# Patient Record
Sex: Female | Born: 2000 | Race: Black or African American | Hispanic: No | Marital: Single | Smoking: Former smoker
Health system: Southern US, Community
[De-identification: ages and names within clinical notes are randomized; demographics above are authoritative.]

## PROBLEM LIST (undated history)

## (undated) DIAGNOSIS — N390 Urinary tract infection, site not specified: Secondary | ICD-10-CM

## (undated) DIAGNOSIS — D649 Anemia, unspecified: Secondary | ICD-10-CM

## (undated) DIAGNOSIS — J45909 Unspecified asthma, uncomplicated: Secondary | ICD-10-CM

## (undated) DIAGNOSIS — R519 Headache, unspecified: Secondary | ICD-10-CM

## (undated) HISTORY — PX: THERAPEUTIC ABORTION: SHX798

---
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
## 2019-03-12 ENCOUNTER — Other Ambulatory Visit: Payer: Self-pay

## 2019-03-12 ENCOUNTER — Encounter (HOSPITAL_COMMUNITY): Payer: Self-pay | Admitting: Obstetrics and Gynecology

## 2019-03-12 ENCOUNTER — Emergency Department (HOSPITAL_COMMUNITY)
Admission: EM | Admit: 2019-03-12 | Discharge: 2019-03-12 | Discharge status: Against Medical Advice | Payer: Self-pay | Attending: Emergency Medicine | Admitting: Emergency Medicine

## 2019-03-12 DIAGNOSIS — Z5321 Procedure and treatment not carried out due to patient leaving prior to being seen by health care provider: Secondary | ICD-10-CM | POA: Insufficient documentation

## 2019-03-12 LAB — URINALYSIS, ROUTINE W REFLEX MICROSCOPIC
Bilirubin Urine: NEGATIVE
Glucose, UA: NEGATIVE mg/dL
Hgb urine dipstick: NEGATIVE
Ketones, ur: NEGATIVE mg/dL
Nitrite: NEGATIVE
Protein, ur: NEGATIVE mg/dL
Specific Gravity, Urine: 1.016 (ref 1.005–1.030)
pH: 8 (ref 5.0–8.0)

## 2019-03-12 LAB — COMPREHENSIVE METABOLIC PANEL
ALT: 17 U/L (ref 0–44)
AST: 17 U/L (ref 15–41)
Albumin: 4.2 g/dL (ref 3.5–5.0)
Alkaline Phosphatase: 66 U/L (ref 38–126)
Anion gap: 8 (ref 5–15)
BUN: 11 mg/dL (ref 6–20)
CO2: 27 mmol/L (ref 22–32)
Calcium: 9.5 mg/dL (ref 8.9–10.3)
Chloride: 104 mmol/L (ref 98–111)
Creatinine, Ser: 0.84 mg/dL (ref 0.44–1.00)
GFR calc Af Amer: >60 mL/min (ref >60)
GFR calc non Af Amer: >60 mL/min (ref >60)
Glucose, Bld: 119 mg/dL — ABNORMAL HIGH (ref 70–99)
Potassium: 4.1 mmol/L (ref 3.5–5.1)
Sodium: 139 mmol/L (ref 135–145)
Total Bilirubin: 0.4 mg/dL (ref 0.3–1.2)
Total Protein: 8 g/dL (ref 6.5–8.1)

## 2019-03-12 LAB — CBC
HCT: 38.2 % (ref 36.0–46.0)
Hemoglobin: 11.5 g/dL — ABNORMAL LOW (ref 12.0–15.0)
MCH: 25.6 pg — ABNORMAL LOW (ref 26.0–34.0)
MCHC: 30.1 g/dL (ref 30.0–36.0)
MCV: 84.9 fL (ref 80.0–100.0)
Platelets: 366 10*3/uL (ref 150–400)
RBC: 4.5 MIL/uL (ref 3.87–5.11)
RDW: 14.3 % (ref 11.5–15.5)
WBC: 10 10*3/uL (ref 4.0–10.5)
nRBC: 0 % (ref 0.0–0.2)

## 2019-03-12 LAB — LIPASE, BLOOD: Lipase: 29 U/L (ref 11–51)

## 2019-03-12 LAB — I-STAT BETA HCG BLOOD, ED (MC, WL, AP ONLY): I-stat hCG, quantitative: <5 m[IU]/mL (ref <5)

## 2019-03-12 MED ORDER — SODIUM CHLORIDE 0.9% FLUSH: 3.0000 mL | Freq: Once | INTRAVENOUS | Status: DC

## 2019-03-12 NOTE — ED Notes (Addendum)
Pt stated that she just wanted to know if she was pregnant. Pt informed that we can not give test results Pt LWBS after triage

## 2019-03-12 NOTE — ED Notes (Signed)
Patient informed registration that she was leaving.  

## 2019-03-12 NOTE — ED Triage Notes (Signed)
Patient reports she woke up this morning and had 10 bouts of emesis. Patient reports she is late for her period and concerned about pregnancy.  Patient reports belly pain but denies diarrhea, chest pain, SOB.

## 2019-05-22 ENCOUNTER — Encounter (HOSPITAL_COMMUNITY): Payer: Self-pay | Admitting: Emergency Medicine

## 2019-05-22 ENCOUNTER — Other Ambulatory Visit: Payer: Self-pay

## 2019-05-22 ENCOUNTER — Emergency Department (HOSPITAL_COMMUNITY)
Admission: EM | Admit: 2019-05-22 | Discharge: 2019-05-22 | Disposition: A | Discharge status: Home / Self Care | Payer: Medicaid - Out of State | Attending: Emergency Medicine | Admitting: Emergency Medicine

## 2019-05-22 DIAGNOSIS — R10813 Right lower quadrant abdominal tenderness: Secondary | ICD-10-CM | POA: Insufficient documentation

## 2019-05-22 DIAGNOSIS — R10814 Left lower quadrant abdominal tenderness: Secondary | ICD-10-CM | POA: Diagnosis not present

## 2019-05-22 DIAGNOSIS — R112 Nausea with vomiting, unspecified: Secondary | ICD-10-CM

## 2019-05-22 LAB — URINALYSIS, ROUTINE W REFLEX MICROSCOPIC
Bilirubin Urine: NEGATIVE
Glucose, UA: NEGATIVE mg/dL
Hgb urine dipstick: NEGATIVE
Ketones, ur: NEGATIVE mg/dL
Nitrite: NEGATIVE
Protein, ur: NEGATIVE mg/dL
Specific Gravity, Urine: 1.018 (ref 1.005–1.030)
pH: 7 (ref 5.0–8.0)

## 2019-05-22 LAB — COMPREHENSIVE METABOLIC PANEL
ALT: 19 U/L (ref 0–44)
AST: 16 U/L (ref 15–41)
Albumin: 4 g/dL (ref 3.5–5.0)
Alkaline Phosphatase: 56 U/L (ref 38–126)
Anion gap: 9 (ref 5–15)
BUN: 13 mg/dL (ref 6–20)
CO2: 22 mmol/L (ref 22–32)
Calcium: 9.5 mg/dL (ref 8.9–10.3)
Chloride: 105 mmol/L (ref 98–111)
Creatinine, Ser: 0.87 mg/dL (ref 0.44–1.00)
GFR calc Af Amer: >60 mL/min (ref >60)
GFR calc non Af Amer: >60 mL/min (ref >60)
Glucose, Bld: 97 mg/dL (ref 70–99)
Potassium: 4.1 mmol/L (ref 3.5–5.1)
Sodium: 136 mmol/L (ref 135–145)
Total Bilirubin: 0.4 mg/dL (ref 0.3–1.2)
Total Protein: 7.6 g/dL (ref 6.5–8.1)

## 2019-05-22 LAB — CBC
HCT: 35.5 % — ABNORMAL LOW (ref 36.0–46.0)
Hemoglobin: 11.3 g/dL — ABNORMAL LOW (ref 12.0–15.0)
MCH: 26.5 pg (ref 26.0–34.0)
MCHC: 31.8 g/dL (ref 30.0–36.0)
MCV: 83.1 fL (ref 80.0–100.0)
Platelets: 310 10*3/uL (ref 150–400)
RBC: 4.27 MIL/uL (ref 3.87–5.11)
RDW: 13.8 % (ref 11.5–15.5)
WBC: 9.3 10*3/uL (ref 4.0–10.5)
nRBC: 0 % (ref 0.0–0.2)

## 2019-05-22 LAB — I-STAT BETA HCG BLOOD, ED (MC, WL, AP ONLY): I-stat hCG, quantitative: <5 m[IU]/mL (ref <5)

## 2019-05-22 LAB — LIPASE, BLOOD: Lipase: 29 U/L (ref 11–51)

## 2019-05-22 MED ORDER — SODIUM CHLORIDE 0.9 % IV BOLUS: 1000.0000 mL | Freq: Once | INTRAVENOUS | Status: DC

## 2019-05-22 MED ORDER — ONDANSETRON 4 MG PO TBDP: 4.0000 mg | ORAL_TABLET | Freq: Three times a day (TID) | ORAL | 0 refills | Status: DC | PRN

## 2019-05-22 MED ORDER — ONDANSETRON 4 MG PO TBDP
4.0000 mg | ORAL_TABLET | Freq: Once | ORAL | Status: AC
  Administered 2019-05-22: 4 mg via ORAL
  Filled 2019-05-22: qty 1

## 2019-05-22 MED ORDER — SODIUM CHLORIDE 0.9% FLUSH
3.0000 mL | Freq: Once | INTRAVENOUS | Status: AC
  Administered 2019-05-22: 22:00:00 3 mL via INTRAVENOUS

## 2019-05-22 NOTE — ED Triage Notes (Signed)
Pt reports vomiting and diarrhea since Sunday. Unable to keep any food or fluids down. Denies any sick contacts Endorses sharp lower abd pain.

## 2019-05-22 NOTE — ED Notes (Signed)
Patient Alert and oriented to baseline. Stable and ambulatory to baseline. Patient verbalized understanding of the discharge instructions.  Patient belongings were taken by the patient.   

## 2019-05-22 NOTE — ED Provider Notes (Signed)
[facility] EMERGENCY DEPARTMENT Provider Note   CSN: [id number] Arrival date & time: 05/22/19  1751     History   Chief Complaint Chief Complaint  Patient presents with  . Emesis  . Diarrhea    HPI Pamela Brown is a 18 y.o. female.     Patient with no past surgical history presents the emergency department with 3-day history of nausea, vomiting.  Patient states that she has been vomiting after eating and drinking.  She states that she has vomited 5 times today but then states that she was able to eat and drink food from a vending machine that she got in the waiting room.  She has had some sharp pains in her lower abdomen.  They do not stay in 1 place.  She does not have any chest pain or shortness of breath.  No fevers.  She denies any urinary symptoms.  She is having 1 bowel movement per day without blood in stools are not watery.  Denies any suspicious food or water exposures.  Denies any recent heavy alcohol use or heavy NSAID use.  No vaginal bleeding.     History reviewed. No pertinent past medical history.  There are no active problems to display for this patient.   No past surgical history on file.   OB History    Gravida  1   Para      Term      Preterm      AB  1   Living  0     SAB      TAB  1   Ectopic      Multiple      Live Births               Home Medications    Prior to Admission medications   Not on File    Family History No family history on file.  Social History Social History   Tobacco Use  . Smoking status: Never Smoker  . Smokeless tobacco: Never Used  Substance Use Topics  . Alcohol use: Not Currently  . Drug use: Not Currently     Allergies   Patient has no allergy information on record.   Review of Systems Review of Systems  Constitutional: Negative for appetite change and fever.  HENT: Negative for rhinorrhea and sore throat.   Eyes: Negative for redness.  Respiratory:  Negative for cough.   Cardiovascular: Negative for chest pain.  Gastrointestinal: Positive for abdominal pain, nausea and vomiting. Negative for blood in stool and diarrhea.       Negative for hematemesis  Genitourinary: Negative for dysuria.  Musculoskeletal: Negative for myalgias.  Skin: Negative for rash.  Neurological: Negative for light-headedness.     Physical Exam Updated Vital Signs BP 131/68 (BP Location: Right Arm)   Pulse 82   Temp 98.4 F (36.9 C) (Oral)   Resp 18   Ht 4\' 11"  (1.499 m)   Wt 70.3 kg   SpO2 100%   BMI 31.31 kg/m   Physical Exam Vitals signs and nursing note reviewed.  Constitutional:      Appearance: She is well-developed.  HENT:     Head: Normocephalic and atraumatic.  Eyes:     General:        Right eye: No discharge.        Left eye: No discharge.     Conjunctiva/sclera: Conjunctivae normal.  Neck:     Musculoskeletal: Normal range of motion and  neck supple.  Cardiovascular:     Rate and Rhythm: Normal rate and regular rhythm.     Heart sounds: Normal heart sounds.  Pulmonary:     Effort: Pulmonary effort is normal.     Breath sounds: Normal breath sounds.  Abdominal:     Palpations: Abdomen is soft.     Tenderness: There is abdominal tenderness. There is no guarding or rebound.     Comments: Patient reports minimal bilateral lower abdominal tenderness.  Patient does not react to light or deep palpation over the lower abdomen.  Abdomen is soft without any signs of rebound or guarding.  Skin:    General: Skin is warm and dry.  Neurological:     Mental Status: She is alert.      ED Treatments / Results  Labs (all labs ordered are listed, but only abnormal results are displayed) Labs Reviewed  CBC - Abnormal; Notable for the following components:      Result Value   Hemoglobin 11.3 (*)    HCT 35.5 (*)    All other components within normal limits  URINALYSIS, ROUTINE W REFLEX MICROSCOPIC - Abnormal; Notable for the following  components:   APPearance HAZY (*)    Leukocytes,Ua TRACE (*)    Bacteria, UA RARE (*)    All other components within normal limits  LIPASE, BLOOD  COMPREHENSIVE METABOLIC PANEL  I-STAT BETA HCG BLOOD, ED ([facility], [facility], [facility] ONLY)    EKG None  Radiology No results found.  Procedures Procedures (including critical care time)  Medications Ordered in ED Medications  sodium chloride flush (NS) 0.9 % injection 3 mL (3 mLs Intravenous Given 05/22/19 2143)  ondansetron (ZOFRAN-ODT) disintegrating tablet 4 mg (4 mg Oral Given 05/22/19 2106)     Initial Impression / Assessment and Plan / ED Course  I have reviewed the triage vital signs and the nursing notes.  Pertinent labs & imaging results that were available during my care of the patient were reviewed by me and considered in my medical decision making (see chart for details).        Patient seen and examined. Work-up reviewed.  Patient looks great.  Will give Zofran and fluid challenge.  She states that she already ate in the lobby.  If she passes p.o. challenge in the emergency department here, anticipate discharged home with Zofran.  Vital signs reviewed and are as follows: BP 131/68 (BP Location: Right Arm)   Pulse 82   Temp 98.4 F (36.9 C) (Oral)   Resp 18   Ht 4\' 11"  (1.499 m)   Wt 70.3 kg   SpO2 100%   BMI 31.31 kg/m   Patient without any further vomiting and she appears well.  Ready for discharge.  Counseled on brat diet and slow advancement.  10:19 PM The patient was urged to return to the Emergency Department immediately with worsening of current symptoms, worsening abdominal pain, persistent vomiting, blood noted in stools, fever, or any other concerns. The patient verbalized understanding.    Final Clinical Impressions(s) / ED Diagnoses   Final diagnoses:  Non-intractable vomiting with nausea, unspecified vomiting type   Patient with N/V. Vitals are stable, no fever.  No signs of dehydration, tolerating PO's.  Lungs are clear. No focal abdominal pain. Preg neg. Low concern for appendicitis, cholecystitis, pancreatitis, ruptured viscus, UTI, kidney stone, aortic dissection, aortic aneurysm or other emergent abdominal etiology. Supportive therapy indicated with return if symptoms worsen. Patient counseled.    ED Discharge Orders  Ordered    ondansetron (ZOFRAN ODT) 4 MG disintegrating tablet  Every 8 hours PRN     05/22/19 2214           Renne CriglerGeiple, Ariyanah Aguado, Cordelia Poche-C 05/22/19 2220    Alvira MondaySchlossman, Erin, MD 05/27/19 2300

## 2019-05-22 NOTE — Discharge Instructions (Signed)
Please read and follow all provided instructions.  Your diagnoses today include:  1. Non-intractable vomiting with nausea, unspecified vomiting type     Tests performed today include:  Blood counts and electrolytes  Blood tests to check liver and kidney function  Blood tests to check pancreas function  Urine test to look for infection and pregnancy (in women)  Vital signs. See below for your results today.   Medications prescribed:   Zofran (ondansetron) - for nausea and vomiting  Take any prescribed medications only as directed.  Home care instructions:   Follow any educational materials contained in this packet.   Drink clear liquids for the next 24 hours and introduce solid foods slowly after 24 hours using the b.r.a.t. diet (Bananas, Rice, Applesauce, Toast, Yogurt).    Follow-up instructions: Please follow-up with your primary care provider in the next 2 days for further evaluation of your symptoms. If you are not feeling better in 48 hours you may have a condition that is more serious and you need re-evaluation.   Return instructions:  SEEK IMMEDIATE MEDICAL ATTENTION IF:  If you have pain that does not go away or becomes severe   A temperature above 101F develops   Repeated vomiting occurs (multiple episodes)   If you have pain that becomes localized to portions of the abdomen. The right side could possibly be appendicitis. In an adult, the left lower portion of the abdomen could be colitis or diverticulitis.   Blood is being passed in stools or vomit (bright red or black tarry stools)   You develop chest pain, difficulty breathing, dizziness or fainting, or become confused, poorly responsive, or inconsolable (young children)  If you have any other emergent concerns regarding your health  Additional Information: Abdominal (belly) pain can be caused by many things. Your caregiver performed an examination and possibly ordered blood/urine tests and imaging (CT  scan, x-rays, ultrasound). Many cases can be observed and treated at home after initial evaluation in the emergency department. Even though you are being discharged home, abdominal pain can be unpredictable. Therefore, you need a repeated exam if your pain does not resolve, returns, or worsens. Most patients with abdominal pain don't have to be admitted to the hospital or have surgery, but serious problems like appendicitis and gallbladder attacks can start out as nonspecific pain. Many abdominal conditions cannot be diagnosed in one visit, so follow-up evaluations are very important.  Your vital signs today were: BP 131/68 (BP Location: Right Arm)    Pulse 82    Temp 98.4 F (36.9 C) (Oral)    Resp 18    Ht 4\' 11"  (1.499 m)    Wt 70.3 kg    SpO2 100%    BMI 31.31 kg/m  If your blood pressure (bp) was elevated above 135/85 this visit, please have this repeated by your doctor within one month. --------------

## 2019-07-06 ENCOUNTER — Encounter (HOSPITAL_COMMUNITY): Payer: Self-pay | Admitting: Emergency Medicine

## 2019-07-06 ENCOUNTER — Other Ambulatory Visit: Payer: Self-pay

## 2019-07-06 ENCOUNTER — Emergency Department (HOSPITAL_COMMUNITY)
Admission: EM | Admit: 2019-07-06 | Discharge: 2019-07-06 | Disposition: A | Discharge status: Home / Self Care | Payer: Medicaid - Out of State | Attending: Emergency Medicine | Admitting: Emergency Medicine

## 2019-07-06 DIAGNOSIS — R55 Syncope and collapse: Secondary | ICD-10-CM | POA: Insufficient documentation

## 2019-07-06 DIAGNOSIS — Z5321 Procedure and treatment not carried out due to patient leaving prior to being seen by health care provider: Secondary | ICD-10-CM | POA: Insufficient documentation

## 2019-07-06 LAB — BASIC METABOLIC PANEL
Anion gap: 10 (ref 5–15)
BUN: 20 mg/dL (ref 6–20)
CO2: 25 mmol/L (ref 22–32)
Calcium: 9.3 mg/dL (ref 8.9–10.3)
Chloride: 102 mmol/L (ref 98–111)
Creatinine, Ser: 0.88 mg/dL (ref 0.44–1.00)
GFR calc Af Amer: >60 mL/min (ref >60)
GFR calc non Af Amer: >60 mL/min (ref >60)
Glucose, Bld: 104 mg/dL — ABNORMAL HIGH (ref 70–99)
Potassium: 4.1 mmol/L (ref 3.5–5.1)
Sodium: 137 mmol/L (ref 135–145)

## 2019-07-06 LAB — CBC
HCT: 38.4 % (ref 36.0–46.0)
Hemoglobin: 12 g/dL (ref 12.0–15.0)
MCH: 26 pg (ref 26.0–34.0)
MCHC: 31.3 g/dL (ref 30.0–36.0)
MCV: 83.3 fL (ref 80.0–100.0)
Platelets: 366 10*3/uL (ref 150–400)
RBC: 4.61 MIL/uL (ref 3.87–5.11)
RDW: 13.7 % (ref 11.5–15.5)
WBC: 10.2 10*3/uL (ref 4.0–10.5)
nRBC: 0 % (ref 0.0–0.2)

## 2019-07-06 LAB — I-STAT BETA HCG BLOOD, ED (MC, WL, AP ONLY): I-stat hCG, quantitative: <5 m[IU]/mL (ref <5)

## 2019-07-06 MED ORDER — SODIUM CHLORIDE 0.9% FLUSH: 3.0000 mL | Freq: Once | INTRAVENOUS | Status: DC

## 2019-07-06 NOTE — ED Triage Notes (Signed)
Headache all day with sensitivity to light. Pt reports couple days ago at work felt like was going to pass out so went home early. Today was supposed to go back to work but felt faint again this morning. needs to be checked out and get a note for work.

## 2019-07-06 NOTE — ED Notes (Signed)
Pt called for recheck vital signs, no response from lobby

## 2019-07-07 ENCOUNTER — Emergency Department (HOSPITAL_COMMUNITY)
Admission: EM | Admit: 2019-07-07 | Discharge: 2019-07-07 | Disposition: A | Discharge status: Home / Self Care | Payer: Medicaid - Out of State | Attending: Emergency Medicine | Admitting: Emergency Medicine

## 2019-07-07 ENCOUNTER — Encounter (HOSPITAL_COMMUNITY): Payer: Self-pay

## 2019-07-07 ENCOUNTER — Other Ambulatory Visit: Payer: Self-pay

## 2019-07-07 ENCOUNTER — Emergency Department (HOSPITAL_COMMUNITY): Payer: Medicaid - Out of State

## 2019-07-07 DIAGNOSIS — R5383 Other fatigue: Secondary | ICD-10-CM | POA: Insufficient documentation

## 2019-07-07 DIAGNOSIS — R531 Weakness: Secondary | ICD-10-CM | POA: Diagnosis not present

## 2019-07-07 DIAGNOSIS — R519 Headache, unspecified: Secondary | ICD-10-CM | POA: Diagnosis present

## 2019-07-07 DIAGNOSIS — R55 Syncope and collapse: Secondary | ICD-10-CM | POA: Insufficient documentation

## 2019-07-07 DIAGNOSIS — Z20822 Contact with and (suspected) exposure to covid-19: Secondary | ICD-10-CM | POA: Diagnosis not present

## 2019-07-07 DIAGNOSIS — R42 Dizziness and giddiness: Secondary | ICD-10-CM | POA: Diagnosis not present

## 2019-07-07 DIAGNOSIS — R0600 Dyspnea, unspecified: Secondary | ICD-10-CM | POA: Diagnosis not present

## 2019-07-07 LAB — BASIC METABOLIC PANEL
Anion gap: 10 (ref 5–15)
BUN: 14 mg/dL (ref 6–20)
CO2: 21 mmol/L — ABNORMAL LOW (ref 22–32)
Calcium: 9.2 mg/dL (ref 8.9–10.3)
Chloride: 105 mmol/L (ref 98–111)
Creatinine, Ser: 0.66 mg/dL (ref 0.44–1.00)
GFR calc Af Amer: >60 mL/min (ref >60)
GFR calc non Af Amer: >60 mL/min (ref >60)
Glucose, Bld: 119 mg/dL — ABNORMAL HIGH (ref 70–99)
Potassium: 4.1 mmol/L (ref 3.5–5.1)
Sodium: 136 mmol/L (ref 135–145)

## 2019-07-07 LAB — POC SARS CORONAVIRUS 2 AG -  ED: SARS Coronavirus 2 Ag: NEGATIVE

## 2019-07-07 LAB — I-STAT BETA HCG BLOOD, ED (MC, WL, AP ONLY): I-stat hCG, quantitative: <5 m[IU]/mL (ref <5)

## 2019-07-07 MED ORDER — DIPHENHYDRAMINE HCL 50 MG/ML IJ SOLN: 25.0000 mg | Freq: Once | INTRAMUSCULAR | Status: DC

## 2019-07-07 MED ORDER — PROCHLORPERAZINE EDISYLATE 10 MG/2ML IJ SOLN: 10.0000 mg | Freq: Once | INTRAMUSCULAR | Status: DC

## 2019-07-07 MED ORDER — SODIUM CHLORIDE 0.9% FLUSH: 3.0000 mL | Freq: Once | INTRAVENOUS | Status: DC

## 2019-07-07 NOTE — ED Notes (Signed)
Pt left before Covid swab could be complete.

## 2019-07-07 NOTE — ED Notes (Signed)
Patient verbalizes understanding of discharge instructions. Opportunity for questioning and answers were provided. Armband removed by staff, pt discharged from ED. Pt. ambulatory and discharged home.  

## 2019-07-07 NOTE — ED Triage Notes (Signed)
C/o headache for past several days and lightheadedness.  Fainted yesterday morning in the bathroom, woke up on floor.  Pt feels lump on left side of head.  No lightheadedness today just headache.  Took tylenol this morning with no relief.   Went to [employer] yesterday but left after waiting several hours.  Needs return to work note.

## 2019-07-07 NOTE — ED Provider Notes (Signed)
[facility] EMERGENCY DEPARTMENT Provider Note   CSN: [id number] Arrival date & time: 07/07/19  1640     History Chief Complaint  Patient presents with  . Loss of Consciousness  . Headache    Pamela Brown is a 19 y.o. female.  HPI     19yo female presents with concern for headache over the last 3 days and episode of syncope.  Has had headaches in the past with light sensitivity but has not had to go to the hospital. Usually improves with tylenol.  Headache has been persistent over last several days. Began slowly and worsened. No family hx of aneurysm.  Has had fatigue, decreased appetite and lightheadedness.  Headache currently 8/10, worse with bright lights.  Yesterday felt lightheaded while in the bathroom, hit head and woke up on floor. Had fatigue with the lightheadedness and brief dyspnea. No palpitations. Has not had any other dyspnea. No cough or congestion.  No vomiting, no diarrhea. No black or bloody stool. No known sick contacts but does work at [employer].   History reviewed. No pertinent past medical history.  There are no problems to display for this patient.   History reviewed. No pertinent surgical history.   OB History    Gravida  1   Para      Term      Preterm      AB  1   Living  0     SAB      TAB  1   Ectopic      Multiple      Live Births              History reviewed. No pertinent family history.  Social History   Tobacco Use  . Smoking status: Never Smoker  . Smokeless tobacco: Never Used  Substance Use Topics  . Alcohol use: Not Currently  . Drug use: Not Currently    Home Medications Prior to Admission medications   Medication Sig Start Date End Date Taking? Authorizing Provider  ondansetron (ZOFRAN ODT) 4 MG disintegrating tablet Take 1 tablet (4 mg total) by mouth every 8 (eight) hours as needed for nausea or vomiting. 05/22/19   Carlisle Cater, PA-C    Allergies    Patient has no known  allergies.  Review of Systems   Review of Systems  Constitutional: Positive for activity change, appetite change and fatigue. Negative for fever.  HENT: Negative for sore throat.   Eyes: Negative for visual disturbance.  Respiratory: Negative for cough and shortness of breath.   Cardiovascular: Negative for chest pain.  Gastrointestinal: Positive for nausea. Negative for abdominal pain, diarrhea and vomiting.  Genitourinary: Negative for difficulty urinating and dysuria.  Musculoskeletal: Positive for neck pain. Negative for back pain.  Skin: Negative for rash.  Neurological: Positive for syncope, light-headedness and headaches. Negative for dizziness, facial asymmetry, weakness and numbness.    Physical Exam Updated Vital Signs BP 121/64 (BP Location: Left Arm)   Pulse 100   Temp 98.6 F (37 C) (Oral)   Resp 16   LMP 06/28/2019   SpO2 99%   Physical Exam Vitals and nursing note reviewed.  Constitutional:      General: She is not in acute distress.    Appearance: She is well-developed. She is not diaphoretic.  HENT:     Head: Normocephalic and atraumatic.  Eyes:     General: No visual field deficit.    Conjunctiva/sclera: Conjunctivae normal.  Cardiovascular:  Rate and Rhythm: Normal rate and regular rhythm.  Pulmonary:     Effort: Pulmonary effort is normal. No respiratory distress.  Abdominal:     General: There is no distension.     Palpations: Abdomen is soft.     Tenderness: There is no abdominal tenderness. There is no guarding.  Musculoskeletal:        General: No tenderness.     Cervical back: Normal range of motion.  Skin:    General: Skin is warm and dry.     Findings: No erythema or rash.  Neurological:     Mental Status: She is alert and oriented to person, place, and time.     GCS: GCS eye subscore is 4. GCS verbal subscore is 5. GCS motor subscore is 6.     Cranial Nerves: Cranial nerves are intact. No cranial nerve deficit, dysarthria or facial  asymmetry.     Sensory: Sensation is intact.     Motor: Motor function is intact. No weakness or pronator drift.     Coordination: Coordination is intact. Coordination normal.     Gait: Gait is intact.     ED Results / Procedures / Treatments   Labs (all labs ordered are listed, but only abnormal results are displayed) Labs Reviewed  BASIC METABOLIC PANEL - Abnormal; Notable for the following components:      Result Value   CO2 21 (*)    Glucose, Bld 119 (*)    All other components within normal limits  URINALYSIS, ROUTINE W REFLEX MICROSCOPIC  CBC  I-STAT BETA HCG BLOOD, ED ([facility], [facility], [facility] ONLY)  POC SARS CORONAVIRUS 2 AG -  ED  CBG MONITORING, ED    EKG EKG Interpretation  Date/Time:  Sunday July 07 2019 16:53:27 EST Ventricular Rate:  79 PR Interval:  142 QRS Duration: 66 QT Interval:  358 QTC Calculation: 410 R Axis:   77 Text Interpretation: Normal sinus rhythm with sinus arrhythmia Normal ECG No significant change since last tracing Confirmed by Theodoro Koval ([id number]) on 07/07/2019 5:41:12 PM   Radiology CT Head Wo Contrast  Result Date: 07/07/2019 CLINICAL DATA:  Headache EXAM: CT HEAD WITHOUT CONTRAST TECHNIQUE: Contiguous axial images were obtained from the base of the skull through the vertex without intravenous contrast. COMPARISON:  None. FINDINGS: Brain: There is no acute intracranial hemorrhage, mass-effect, or edema. Gray-white differentiation is preserved. There is no extra-axial fluid collection. Ventricles and sulci are within normal limits in size and configuration. Vascular: Negative Skull: Calvarium is unremarkable. Sinuses/Orbits: No acute finding. Other: None. IMPRESSION: No acute intracranial abnormality. Electronically Signed   By: Praneil  Patel M.D.   On: 07/07/2019 19:53    Procedures Procedures (including critical care time)  Medications Ordered in ED Medications - No data to display  ED Course  I have reviewed the triage vital signs and  the nursing notes.  Pertinent labs & imaging results that were available during my care of the patient were reviewed by me and considered in my medical decision making (see chart for details).    MDM Rules/Calculators/A&P                      18 yo female presents with concern for headache over the last 3 days and episode of syncope.  Negative pregnancy test. ECG without acute findings. No significant electrolyte abnormalities. CT head without signs of ICH.  COVID antigen negative. Headache with migrainous components, however given other generalized weakness, fatigue, consider other viral  component. Discussed desire to do PCR testing for COVID however unfortunately pt left prior to having swab completed.    Final Clinical Impression(s) / ED Diagnoses Final diagnoses:  Acute nonintractable headache, unspecified headache type  Syncope, unspecified syncope type  Lightheadedness  COVID-19 virus test result unknown    Rx / DC Orders ED Discharge Orders    None       Alvira Monday, MD 07/09/19 1455

## 2019-10-24 ENCOUNTER — Emergency Department (HOSPITAL_COMMUNITY): Payer: Medicaid - Out of State

## 2019-10-24 ENCOUNTER — Telehealth: Payer: Medicaid - Out of State

## 2019-10-24 ENCOUNTER — Emergency Department (HOSPITAL_COMMUNITY)
Admission: EM | Admit: 2019-10-24 | Discharge: 2019-10-24 | Disposition: A | Discharge status: Home / Self Care | Payer: Medicaid - Out of State | Attending: Emergency Medicine | Admitting: Emergency Medicine

## 2019-10-24 ENCOUNTER — Encounter (HOSPITAL_COMMUNITY): Payer: Self-pay | Admitting: Emergency Medicine

## 2019-10-24 ENCOUNTER — Other Ambulatory Visit: Payer: Self-pay

## 2019-10-24 DIAGNOSIS — R10816 Epigastric abdominal tenderness: Secondary | ICD-10-CM | POA: Insufficient documentation

## 2019-10-24 DIAGNOSIS — R0602 Shortness of breath: Secondary | ICD-10-CM | POA: Diagnosis not present

## 2019-10-24 DIAGNOSIS — R0789 Other chest pain: Secondary | ICD-10-CM | POA: Diagnosis present

## 2019-10-24 DIAGNOSIS — R079 Chest pain, unspecified: Secondary | ICD-10-CM

## 2019-10-24 LAB — CBC
HCT: 37.5 % (ref 36.0–46.0)
Hemoglobin: 11.6 g/dL — ABNORMAL LOW (ref 12.0–15.0)
MCH: 25.7 pg — ABNORMAL LOW (ref 26.0–34.0)
MCHC: 30.9 g/dL (ref 30.0–36.0)
MCV: 83 fL (ref 80.0–100.0)
Platelets: 335 10*3/uL (ref 150–400)
RBC: 4.52 MIL/uL (ref 3.87–5.11)
RDW: 13.5 % (ref 11.5–15.5)
WBC: 7.9 10*3/uL (ref 4.0–10.5)
nRBC: 0 % (ref 0.0–0.2)

## 2019-10-24 LAB — BASIC METABOLIC PANEL
Anion gap: 9 (ref 5–15)
BUN: 10 mg/dL (ref 6–20)
CO2: 27 mmol/L (ref 22–32)
Calcium: 9.5 mg/dL (ref 8.9–10.3)
Chloride: 101 mmol/L (ref 98–111)
Creatinine, Ser: 0.81 mg/dL (ref 0.44–1.00)
GFR calc Af Amer: >60 mL/min (ref >60)
GFR calc non Af Amer: >60 mL/min (ref >60)
Glucose, Bld: 96 mg/dL (ref 70–99)
Potassium: 3.6 mmol/L (ref 3.5–5.1)
Sodium: 137 mmol/L (ref 135–145)

## 2019-10-24 LAB — I-STAT BETA HCG BLOOD, ED (MC, WL, AP ONLY): I-stat hCG, quantitative: <5 m[IU]/mL

## 2019-10-24 LAB — TROPONIN I (HIGH SENSITIVITY)
Troponin I (High Sensitivity): <2 ng/L (ref <18)
Troponin I (High Sensitivity): 3 ng/L (ref <18)

## 2019-10-24 NOTE — Discharge Instructions (Signed)
Try zantac or pepcid twice a day.  Try to avoid things that may make this worse, most commonly these are spicy foods tomato based products fatty foods chocolate and peppermint.  Alcohol and tobacco can also make this worse.  Return to the emergency department for sudden worsening pain fever or inability to eat or drink. ° °

## 2019-10-24 NOTE — ED Provider Notes (Signed)
[facility] EMERGENCY DEPARTMENT Provider Note   CSN: [id number] Arrival date & time: 10/24/19  8242     History Chief Complaint  Patient presents with  . Chest Pain    Pamela Brown is a 19 y.o. female.  19 yo F with a chief complaint of chest pain.  This started last night while she was at work.  Described it as a sharp pain worse with deep breathing.  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  She denies exertional symptoms.  Denies trauma.  Denies cough congestion or fever.  She denies history of PE or DVT denies hemoptysis denies unilateral lower extremity edema denies recent surgery immobilization or hospitalization.  Denies estrogen use.   The history is provided by the patient.  Chest Pain Pain location:  Epigastric Pain quality: sharp   Pain radiates to:  Does not radiate Pain severity:  Moderate Onset quality:  Gradual Duration:  1 day Timing:  Constant Progression:  Unchanged Chronicity:  Recurrent Relieved by:  Nothing Worsened by:  Deep breathing Ineffective treatments:  None tried Associated symptoms: shortness of breath   Associated symptoms: no dizziness, no fever, no headache, no nausea, no palpitations and no vomiting        History reviewed. No pertinent past medical history.  There are no problems to display for this patient.   History reviewed. No pertinent surgical history.   OB History    Gravida  1   Para      Term      Preterm      AB  1   Living  0     SAB      TAB  1   Ectopic      Multiple      Live Births              No family history on file.  Social History   Tobacco Use  . Smoking status: Never Smoker  . Smokeless tobacco: Never Used  Substance Use Topics  . Alcohol use: Not Currently  . Drug use: Not Currently    Home Medications Prior to Admission medications   Medication Sig  Start Date End Date Taking? Authorizing Provider  ondansetron (ZOFRAN ODT) 4 MG disintegrating tablet Take 1 tablet (4 mg total) by mouth every 8 (eight) hours as needed for nausea or vomiting. 05/22/19   Renne Crigler, PA-C    Allergies    Patient has no known allergies.  Review of Systems   Review of Systems  Constitutional: Negative for chills and fever.  HENT: Negative for congestion and rhinorrhea.   Eyes: Negative for redness and visual disturbance.  Respiratory: Positive for shortness of breath. Negative for wheezing.   Cardiovascular: Positive for chest pain. Negative for palpitations.  Gastrointestinal: Negative for nausea and vomiting.  Genitourinary: Negative for dysuria and urgency.  Musculoskeletal: Negative for arthralgias and myalgias.  Skin: Negative for pallor and wound.  Neurological: Negative for dizziness and headaches.    Physical Exam Updated Vital Signs BP 114/71 (BP Location: Right Arm)   Pulse 80   Temp 98.4 F (36.9 C)   Resp 17   Ht 5' (1.524 m)   Wt 72.6 kg   LMP 10/17/2019   SpO2 98%   BMI 31.25 kg/m   Physical Exam Vitals and nursing note reviewed.  Constitutional:  General: She is not in acute distress.    Appearance: She is well-developed. She is not diaphoretic.  HENT:     Head: Normocephalic and atraumatic.  Eyes:     Pupils: Pupils are equal, round, and reactive to light.  Cardiovascular:     Rate and Rhythm: Normal rate and regular rhythm.     Heart sounds: No murmur. No friction rub. No gallop.   Pulmonary:     Effort: Pulmonary effort is normal.     Breath sounds: No wheezing or rales.  Chest:     Chest wall: No tenderness.  Abdominal:     General: There is no distension.     Palpations: Abdomen is soft.     Tenderness: There is abdominal tenderness.     Comments: Mild epigastric tenderness  Musculoskeletal:        General: No tenderness.     Cervical back: Normal range of motion and neck supple.  Skin:     General: Skin is warm and dry.  Neurological:     Mental Status: She is alert and oriented to person, place, and time.  Psychiatric:        Behavior: Behavior normal.     ED Results / Procedures / Treatments   Labs (all labs ordered are listed, but only abnormal results are displayed) Labs Reviewed  CBC - Abnormal; Notable for the following components:      Result Value   Hemoglobin 11.6 (*)    MCH 25.7 (*)    All other components within normal limits  BASIC METABOLIC PANEL  I-STAT BETA HCG BLOOD, ED (MC, [facility], AP ONLY)  TROPONIN I (HIGH SENSITIVITY)  TROPONIN I (HIGH SENSITIVITY)    EKG EKG Interpretation  Date/Time:  Thursday October 24 2019 07:32:35 EDT Ventricular Rate:  75 PR Interval:  160 QRS Duration: 66 QT Interval:  372 QTC Calculation: 415 R Axis:   70 Text Interpretation: Normal sinus rhythm with sinus arrhythmia Normal ECG No significant change since last tracing Confirmed by Melene Plan [phone]) on 10/24/2019 10:50:57 AM   Radiology DG Chest 2 View  Result Date: 10/24/2019 CLINICAL DATA:  Shortness of breath.  Chest pain. EXAM: CHEST - 2 VIEW COMPARISON:  No prior. FINDINGS: Mediastinum hilar structures normal. Low lung volumes. No focal alveolar infiltrate. No pleural effusion or pneumothorax. No acute bony abnormality. IMPRESSION: Low lung volumes.  No focal alveolar infiltrate. Electronically Signed   By: Maisie Fus  Register   On: 10/24/2019 08:22    Procedures Procedures (including critical care time)  Medications Ordered in ED Medications - No data to display  ED Course  I have reviewed the triage vital signs and the nursing notes.  Pertinent labs & imaging results that were available during my care of the patient were reviewed by me and considered in my medical decision making (see chart for details).    MDM Rules/Calculators/A&P                      19 yo F with a chief complaint of chest pain.  Is atypical of ACS she is a unremarkable EKG and chest  x-ray.  I do not feel that any further work-up is needed.  As per our triage orders the process she did have a troponin that was negative.  She is PERC negative.  We will have her follow-up with her family doctor.  We will treat his reflux as this seems to be worse with maneuvers that increase her  intra-abdominal pressure.  11:16 AM:  I have discussed the diagnosis/risks/treatment options with the patient and believe the pt to be eligible for discharge home to follow-up with PCP. We also discussed returning to the ED immediately if new or worsening sx occur. We discussed the sx which are most concerning (e.g., sudden worsening pain, fever, inability to tolerate by mouth) that necessitate immediate return. Medications administered to the patient during their visit and any new prescriptions provided to the patient are listed below.  Medications given during this visit Medications - No data to display   The patient appears reasonably screen and/or stabilized for discharge and I doubt any other medical condition or other [facility] requiring further screening, evaluation, or treatment in the ED at this time prior to discharge.   Final Clinical Impression(s) / ED Diagnoses Final diagnoses:  Nonspecific chest pain    Rx / DC Orders ED Discharge Orders    None       Deno Etienne, DO 10/24/19 1116

## 2019-10-24 NOTE — ED Triage Notes (Signed)
Pt endorses left sided chest pain and SOB. States she had asthma as a kid but has not had any issues in a long time with her breathing.

## 2020-01-09 ENCOUNTER — Emergency Department (HOSPITAL_COMMUNITY)
Admission: EM | Admit: 2020-01-09 | Discharge: 2020-01-10 | Disposition: A | Discharge status: Home / Self Care | Payer: Medicaid - Out of State | Attending: Emergency Medicine | Admitting: Emergency Medicine

## 2020-01-09 DIAGNOSIS — Y9389 Activity, other specified: Secondary | ICD-10-CM | POA: Insufficient documentation

## 2020-01-09 DIAGNOSIS — S6992XA Unspecified injury of left wrist, hand and finger(s), initial encounter: Secondary | ICD-10-CM | POA: Diagnosis present

## 2020-01-09 DIAGNOSIS — Z20822 Contact with and (suspected) exposure to covid-19: Secondary | ICD-10-CM | POA: Diagnosis not present

## 2020-01-09 DIAGNOSIS — S61512A Laceration without foreign body of left wrist, initial encounter: Secondary | ICD-10-CM | POA: Diagnosis not present

## 2020-01-09 DIAGNOSIS — Y929 Unspecified place or not applicable: Secondary | ICD-10-CM | POA: Insufficient documentation

## 2020-01-09 DIAGNOSIS — Z7289 Other problems related to lifestyle: Secondary | ICD-10-CM | POA: Diagnosis not present

## 2020-01-09 DIAGNOSIS — Y999 Unspecified external cause status: Secondary | ICD-10-CM | POA: Diagnosis not present

## 2020-01-09 DIAGNOSIS — F331 Major depressive disorder, recurrent, moderate: Secondary | ICD-10-CM | POA: Insufficient documentation

## 2020-01-09 DIAGNOSIS — Z23 Encounter for immunization: Secondary | ICD-10-CM | POA: Insufficient documentation

## 2020-01-09 DIAGNOSIS — Z046 Encounter for general psychiatric examination, requested by authority: Secondary | ICD-10-CM | POA: Diagnosis not present

## 2020-01-09 DIAGNOSIS — X781XXA Intentional self-harm by knife, initial encounter: Secondary | ICD-10-CM | POA: Diagnosis not present

## 2020-01-09 DIAGNOSIS — R45851 Suicidal ideations: Secondary | ICD-10-CM | POA: Insufficient documentation

## 2020-01-09 DIAGNOSIS — X789XXA Intentional self-harm by unspecified sharp object, initial encounter: Secondary | ICD-10-CM

## 2020-01-09 LAB — COMPREHENSIVE METABOLIC PANEL
ALT: 21 U/L (ref 0–44)
AST: 20 U/L (ref 15–41)
Albumin: 4.1 g/dL (ref 3.5–5.0)
Alkaline Phosphatase: 58 U/L (ref 38–126)
Anion gap: 8 (ref 5–15)
BUN: 12 mg/dL (ref 6–20)
CO2: 25 mmol/L (ref 22–32)
Calcium: 9.8 mg/dL (ref 8.9–10.3)
Chloride: 104 mmol/L (ref 98–111)
Creatinine, Ser: 0.75 mg/dL (ref 0.44–1.00)
GFR calc Af Amer: >60 mL/min (ref >60)
GFR calc non Af Amer: >60 mL/min (ref >60)
Glucose, Bld: 91 mg/dL (ref 70–99)
Potassium: 3.6 mmol/L (ref 3.5–5.1)
Sodium: 137 mmol/L (ref 135–145)
Total Bilirubin: <0.1 mg/dL — ABNORMAL LOW (ref 0.3–1.2)
Total Protein: 7.5 g/dL (ref 6.5–8.1)

## 2020-01-09 LAB — CBC
HCT: 36.9 % (ref 36.0–46.0)
Hemoglobin: 11.3 g/dL — ABNORMAL LOW (ref 12.0–15.0)
MCH: 25.2 pg — ABNORMAL LOW (ref 26.0–34.0)
MCHC: 30.6 g/dL (ref 30.0–36.0)
MCV: 82.2 fL (ref 80.0–100.0)
Platelets: 318 10*3/uL (ref 150–400)
RBC: 4.49 MIL/uL (ref 3.87–5.11)
RDW: 13.9 % (ref 11.5–15.5)
WBC: 8.3 10*3/uL (ref 4.0–10.5)
nRBC: 0 % (ref 0.0–0.2)

## 2020-01-09 LAB — I-STAT BETA HCG BLOOD, ED (MC, WL, AP ONLY): I-stat hCG, quantitative: <5 m[IU]/mL (ref <5)

## 2020-01-09 LAB — ETHANOL: Alcohol, Ethyl (B): <10 mg/dL (ref <10)

## 2020-01-09 LAB — RAPID URINE DRUG SCREEN, HOSP PERFORMED
Amphetamines: NOT DETECTED
Barbiturates: NOT DETECTED
Benzodiazepines: NOT DETECTED
Cocaine: NOT DETECTED
Opiates: NOT DETECTED
Tetrahydrocannabinol: NOT DETECTED

## 2020-01-09 LAB — ACETAMINOPHEN LEVEL: Acetaminophen (Tylenol), Serum: <10 ug/mL — ABNORMAL LOW (ref 10–30)

## 2020-01-09 LAB — SALICYLATE LEVEL: Salicylate Lvl: <7 mg/dL — ABNORMAL LOW (ref 7.0–30.0)

## 2020-01-09 LAB — SARS CORONAVIRUS 2 BY RT PCR (HOSPITAL ORDER, PERFORMED IN ~~LOC~~ HOSPITAL LAB): SARS Coronavirus 2: NEGATIVE

## 2020-01-09 MED ORDER — BACITRACIN ZINC 500 UNIT/GM EX OINT
TOPICAL_OINTMENT | Freq: Once | CUTANEOUS | Status: AC
  Administered 2020-01-09: 1 via TOPICAL
  Filled 2020-01-09: qty 0.9

## 2020-01-09 MED ORDER — TETANUS-DIPHTH-ACELL PERTUSSIS 5-2.5-18.5 LF-MCG/0.5 IM SUSP
0.5000 mL | Freq: Once | INTRAMUSCULAR | Status: AC
  Administered 2020-01-09: 0.5 mL via INTRAMUSCULAR
  Filled 2020-01-09: qty 0.5
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
## 2020-01-09 NOTE — ED Provider Notes (Signed)
[facility] EMERGENCY DEPARTMENT Provider Note   CSN: [id number] Arrival date & time: 01/09/20  1523     History Chief Complaint  Patient presents with  . Suicidal    Pamela Brown is a 19 y.o. female with no known past medical history.  HPI Patient presents to emergency department today with chief complaint of suicidal thoughts and depression.  Patient states she has never formally been diagnosed with depression.  She states she moved to [place] x7 months ago with her boyfriend.  They live in an apartment together.  When her boyfriend goes to visit his family that lives nearby she feels very alone and depressed.  She has a history of self mutilation.  She does have a history of cutting her wrists.  She states when she was feeling sad last night she cut her left wrist with a knife.  She is unsure of last tetanus immunization.  She states suicidal ideations are intermittent and she does not feel suicidal right now.  She has no plan of suicide.  She denies any feelings of homicidal ideations, no auditory or visual hallucinations.  She states she drinks alcohol occasionally and denies any drug use.    No past medical history on file.  There are no problems to display for this patient.   No past surgical history on file.   OB History    Gravida  1   Para      Term      Preterm      AB  1   Living  0     SAB      TAB  1   Ectopic      Multiple      Live Births              No family history on file.  Social History   Tobacco Use  . Smoking status: Never Smoker  . Smokeless tobacco: Never Used  Substance Use Topics  . Alcohol use: Not Currently  . Drug use: Not Currently    Home Medications Prior to Admission medications   Medication Sig Start Date End Date Taking? Authorizing Provider  ondansetron (ZOFRAN ODT) 4 MG disintegrating tablet Take 1 tablet (4 mg total) by mouth every 8 (eight) hours as needed for nausea or  vomiting. 05/22/19   Renne Crigler, PA-C    Allergies    Patient has no known allergies.  Review of Systems   Review of Systems  Constitutional: Negative for chills and fever.  HENT: Negative for congestion, ear discharge, ear pain, sinus pressure, sinus pain and sore throat.   Eyes: Negative for pain and redness.  Respiratory: Negative for cough and shortness of breath.   Cardiovascular: Negative for chest pain.  Gastrointestinal: Negative for abdominal pain, constipation, diarrhea, nausea and vomiting.  Genitourinary: Negative for dysuria and hematuria.  Musculoskeletal: Negative for back pain and neck pain.  Skin: Positive for wound.  Neurological: Negative for weakness, numbness and headaches.  Psychiatric/Behavioral: Positive for self-injury and suicidal ideas. Negative for hallucinations.    Physical Exam Updated Vital Signs BP 98/72 (BP Location: Right Arm)   Pulse 78   Temp 98.1 F (36.7 C)   Resp 14   SpO2 100%   Physical Exam Vitals and nursing note reviewed.  Constitutional:      General: She is not in acute distress.    Appearance: She is not ill-appearing.  HENT:     Head: Normocephalic and atraumatic.  Right Ear: Tympanic membrane and external ear normal.     Left Ear: Tympanic membrane and external ear normal.     Nose: Nose normal.     Mouth/Throat:     Mouth: Mucous membranes are moist.     Pharynx: Oropharynx is clear.  Eyes:     General: No scleral icterus.       Right eye: No discharge.        Left eye: No discharge.     Extraocular Movements: Extraocular movements intact.     Conjunctiva/sclera: Conjunctivae normal.     Pupils: Pupils are equal, round, and reactive to light.  Neck:     Vascular: No JVD.  Cardiovascular:     Rate and Rhythm: Normal rate and regular rhythm.     Pulses: Normal pulses.          Radial pulses are 2+ on the right side and 2+ on the left side.     Heart sounds: Normal heart sounds.  Pulmonary:     Comments:  Lungs clear to auscultation in all fields. Symmetric chest rise. No wheezing, rales, or rhonchi. Abdominal:     Comments: Abdomen is soft, non-distended, and non-tender in all quadrants. No rigidity, no guarding. No peritoneal signs.  Musculoskeletal:        General: Normal range of motion.     Cervical back: Normal range of motion.     Comments: Strong and equal grip strength in bilateral upper extremities.  Full range of motion of left wrist.  Radial pulse 2+ bilaterally.  Skin:    General: Skin is warm and dry.     Capillary Refill: Capillary refill takes less than 2 seconds.     Comments: Left posterior wrist with superficial linear laceration.  No active bleeding.  No purulent drainage.  She has surrounding linear scars  Neurological:     Mental Status: She is oriented to person, place, and time.     GCS: GCS eye subscore is 4. GCS verbal subscore is 5. GCS motor subscore is 6.     Comments: Fluent speech, no facial droop.  Psychiatric:        Behavior: Behavior normal.        Thought Content: Thought content includes suicidal ideation. Thought content does not include homicidal ideation. Thought content does not include homicidal or suicidal plan.     ED Results / Procedures / Treatments   Labs (all labs ordered are listed, but only abnormal results are displayed) Labs Reviewed  COMPREHENSIVE METABOLIC PANEL - Abnormal; Notable for the following components:      Result Value   Total Bilirubin <0.1 (*)    All other components within normal limits  SALICYLATE LEVEL - Abnormal; Notable for the following components:   Salicylate Lvl <7.0 (*)    All other components within normal limits  ACETAMINOPHEN LEVEL - Abnormal; Notable for the following components:   Acetaminophen (Tylenol), Serum <10 (*)    All other components within normal limits  CBC - Abnormal; Notable for the following components:   Hemoglobin 11.3 (*)    MCH 25.2 (*)    All other components within normal limits   SARS CORONAVIRUS 2 BY RT PCR (HOSPITAL ORDER, PERFORMED IN [facility])  ETHANOL  RAPID URINE DRUG SCREEN, HOSP PERFORMED  I-STAT BETA HCG BLOOD, ED ([facility], [facility], [facility] ONLY)    EKG None  Radiology No results found.  Procedures Procedures (including critical care time)  Medications Ordered in  ED Medications  bacitracin ointment (1 application Topical Given 01/09/20 1903)  Tdap (BOOSTRIX) injection 0.5 mL (0.5 mLs Intramuscular Given 01/09/20 1904)    ED Course  I have reviewed the triage vital signs and the nursing notes.  Pertinent labs & imaging results that were available during my care of the patient were reviewed by me and considered in my medical decision making (see chart for details).    MDM Rules/Calculators/A&P                          History provided by patient with additional history obtained from chart review.    No medical complaints today.  Labs ordered and reviewed. They are unremarkable.  She does have superficial laceration on left wrist without any active bleeding.  Not amenable to suture repair.  Wound care provided.  Tetanus updated.  Patient is here voluntarily and is very interested in seeking help.  At this time do not feel IVC is needed, will continue to closely monitor   Pt is medically cleared for TTS evaluation, disposition per [facility]. Signed out to default provider. Diet ordered. No home medications to order   Portions of this note were generated with Dragon dictation software. Dictation errors may occur despite best attempts at proofreading.    Final Clinical Impression(s) / ED Diagnoses Final diagnoses:  Self-cutting of wrist [facility])  Suicidal thoughts    Rx / DC Orders ED Discharge Orders    None       Kathyrn Lass 01/09/20 2050    Benjiman Core, MD 01/10/20 [phone]

## 2020-01-09 NOTE — ED Triage Notes (Signed)
Patient reported she is a cutter and has had suicidal thoughts. Stated she's had it before and every time she's depressed especially now she feels like cutting herself more.

## 2020-01-10 ENCOUNTER — Other Ambulatory Visit: Payer: Self-pay

## 2020-01-10 DIAGNOSIS — S61512A Laceration without foreign body of left wrist, initial encounter: Secondary | ICD-10-CM | POA: Diagnosis not present

## 2020-01-10 NOTE — ED Notes (Signed)
MD states that pt is psych and medically cleared and will be ready for dc.

## 2020-01-10 NOTE — ED Notes (Signed)
Patient denies pain and is resting comfortably.  

## 2020-01-10 NOTE — ED Provider Notes (Signed)
Cleared by [facility]. Will follow up later today at [facility] open access.  The patient appears reasonably screened and/or stabilized for discharge and I doubt any other medical condition or other [facility] requiring further screening, evaluation, or treatment in the ED at this time prior to discharge. Safe for discharge with strict return precautions.  Disposition: Discharge  Condition: Good  I have discussed the results, Dx and Tx plan with the patient/family who expressed understanding and agree(s) with the plan. Discharge instructions discussed at length. The patient/family was given strict return precautions who verbalized understanding of the instructions. No further questions at time of discharge.    ED Discharge Orders    None       Follow Up: [facility] [address] [place] [address] [phone] Go today to open access from 1-5p every Friday      Nira Conn, MD 01/10/20 [phone]

## 2020-05-08 ENCOUNTER — Encounter (HOSPITAL_COMMUNITY): Payer: Self-pay | Admitting: Emergency Medicine

## 2020-05-08 ENCOUNTER — Ambulatory Visit (HOSPITAL_COMMUNITY)
Admission: EM | Admit: 2020-05-08 | Discharge: 2020-05-08 | Disposition: A | Discharge status: Home / Self Care | Payer: Medicaid - Out of State

## 2020-05-08 ENCOUNTER — Other Ambulatory Visit: Payer: Self-pay

## 2020-05-08 ENCOUNTER — Ambulatory Visit (INDEPENDENT_AMBULATORY_CARE_PROVIDER_SITE_OTHER): Payer: Medicaid - Out of State

## 2020-05-08 DIAGNOSIS — M7989 Other specified soft tissue disorders: Secondary | ICD-10-CM

## 2020-05-08 DIAGNOSIS — S93601A Unspecified sprain of right foot, initial encounter: Secondary | ICD-10-CM

## 2020-05-08 HISTORY — DX: Unspecified asthma, uncomplicated: J45.909

## 2020-05-08 HISTORY — DX: Anemia, unspecified: D64.9

## 2020-05-08 MED ORDER — IBUPROFEN 800 MG PO TABS
800.0000 mg | ORAL_TABLET | Freq: Three times a day (TID) | ORAL | 0 refills | Status: DC
Start: 2020-05-08 — End: 2022-02-12

## 2020-05-08 NOTE — ED Triage Notes (Signed)
Patient c/o RT foot pain since yesterday (morning).   Patient took Tylenol w/ some relief of symptoms.    Patient endorses "throbbing pain" and swelling to foot. Patient endorses difficulty with ambulation.     Patient fell due to lifted flooring at home.

## 2020-05-08 NOTE — Discharge Instructions (Signed)
No fracture on xray Use anti-inflammatories for pain/swelling. You may take up to 800 mg Ibuprofen every 8 hours with food. You may supplement Ibuprofen with Tylenol [phone] mg every 8 hours.  Rest, ice and elevate Gradual ease back into activities Follow up for any concerns

## 2020-05-08 NOTE — ED Provider Notes (Signed)
[facility]    CSN: [id number] Arrival date & time: 05/08/20  1221      History   Chief Complaint Chief Complaint  Patient presents with  . Foot Pain    HPI Pamela Brown is a 19 y.o. female presenting today for evaluation of foot injury.  Patient was walking at home yesterday and tripped over a piece of flooring that was slightly elevated.  This caused her foot to go behind her and landed directly on the floor.  Since she has had pain and swelling to the top portion of her foot.  Denies prior injury.  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  HPI  Past Medical History:  Diagnosis Date  . Anemia   . Asthma     There are no problems to display for this patient.   History reviewed. No pertinent surgical history.  OB History    Gravida  1   Para      Term      Preterm      AB  1   Living  0     SAB      TAB  1   Ectopic      Multiple      Live Births               Home Medications    Prior to Admission medications   Medication Sig Start Date End Date Taking? Authorizing Provider  acetaminophen (TYLENOL) 500 MG tablet Take 500 mg by mouth every 6 (six) hours as needed.   Yes [provider]  ibuprofen (ADVIL) 800 MG tablet Take 1 tablet (800 mg total) by mouth 3 (three) times daily. 05/08/20   Dyana Magner, Junius Creamer, PA-C    Family History History reviewed. No pertinent family history.  Social History Social History   Tobacco Use  . Smoking status: Never Smoker  . Smokeless tobacco: Never Used  Substance Use Topics  . Alcohol use: Not Currently  . Drug use: Not Currently     Allergies   Patient has no known allergies.   Review of Systems Review of Systems  Constitutional: Negative for fatigue and fever.  Eyes: Negative for visual disturbance.  Respiratory: Negative for shortness of breath.   Cardiovascular: Negative for chest pain.  Gastrointestinal: Negative for  abdominal pain, nausea and vomiting.  Musculoskeletal: Positive for arthralgias, gait problem and joint swelling.  Skin: Negative for color change, rash and wound.  Neurological: Negative for dizziness, weakness, light-headedness and headaches.     Physical Exam Triage Vital Signs ED Triage Vitals  Enc Vitals Group     BP      Pulse      Resp      Temp      Temp src      SpO2      Weight      Height      Head Circumference      Peak Flow      Pain Score      Pain Loc      Pain Edu?      Excl. in GC?    No data found.  Updated Vital Signs BP 116/83 (BP Location: Right Arm)   Pulse (!) 102   Temp 98.3 F (36.8 C) (Oral)   Resp 14   LMP 04/18/2020   SpO2 100%   Visual Acuity Right Eye Distance:   Left  Eye Distance:   Bilateral Distance:    Right Eye Near:   Left Eye Near:    Bilateral Near:     Physical Exam Vitals and nursing note reviewed.  Constitutional:      Appearance: She is well-developed.     Comments: No acute distress  HENT:     Head: Normocephalic and atraumatic.     Nose: Nose normal.  Eyes:     Conjunctiva/sclera: Conjunctivae normal.  Cardiovascular:     Rate and Rhythm: Normal rate.  Pulmonary:     Effort: Pulmonary effort is normal. No respiratory distress.  Abdominal:     General: There is no distension.  Musculoskeletal:        General: Normal range of motion.     Cervical back: Neck supple.     Comments: Right foot: Mild swelling and faint erythema noted across mid dorsum of foot, no swelling or tenderness to medial lateral malleolus of ankle, tenderness diffusely across dorsum of foot extending towards distal metatarsals, sensation intact distally, able to wiggle toes, dorsalis pedis 2+  Skin:    General: Skin is warm and dry.  Neurological:     Mental Status: She is alert and oriented to person, place, and time.      UC Treatments / Results  Labs (all labs ordered are listed, but only abnormal results are displayed) Labs  Reviewed - No data to display  EKG   Radiology DG Foot Complete Right  Result Date: 05/08/2020 CLINICAL DATA:  Status post fall, pain, swelling EXAM: RIGHT FOOT COMPLETE - 3+ VIEW COMPARISON:  None. FINDINGS: There is no evidence of fracture or dislocation. There is no evidence of arthropathy or other focal bone abnormality. Soft tissues are unremarkable. IMPRESSION: Negative. Electronically Signed   By: Elige Ko   On: 05/08/2020 13:56    Procedures Procedures (including critical care time)  Medications Ordered in UC Medications - No data to display  Initial Impression / Assessment and Plan / UC Course  I have reviewed the triage vital signs and the nursing notes.  Pertinent labs & imaging results that were available during my care of the patient were reviewed by me and considered in my medical decision making (see chart for details).     X-ray without acute abnormality, suspect likely contusion/sprain.  Treating with Ace wrap, anti-inflammatories rest ice and elevation.  Discussed strict return precautions. Patient verbalized understanding and is agreeable with plan.  Final Clinical Impressions(s) / UC Diagnoses   Final diagnoses:  Sprain of right foot, initial encounter     Discharge Instructions     No fracture on xray Use anti-inflammatories for pain/swelling. You may take up to 800 mg Ibuprofen every 8 hours with food. You may supplement Ibuprofen with Tylenol [phone] mg every 8 hours.  Rest, ice and elevate Gradual ease back into activities Follow up for any concerns    ED Prescriptions    Medication Sig Dispense Auth. Provider   ibuprofen (ADVIL) 800 MG tablet Take 1 tablet (800 mg total) by mouth 3 (three) times daily. 21 tablet Bonnell Placzek, Kite C, PA-C     PDMP not reviewed this encounter.   Lew Dawes, PA-C 05/08/20 1416

## 2021-04-01 IMAGING — DX DG FOOT COMPLETE 3+V*R*
3 series · 3 of 3 positions shown · non-contrast
Comparison: None.

CLINICAL DATA: Status post fall, pain, swelling

EXAM:
RIGHT FOOT COMPLETE - 3+ VIEW

[foot ap]
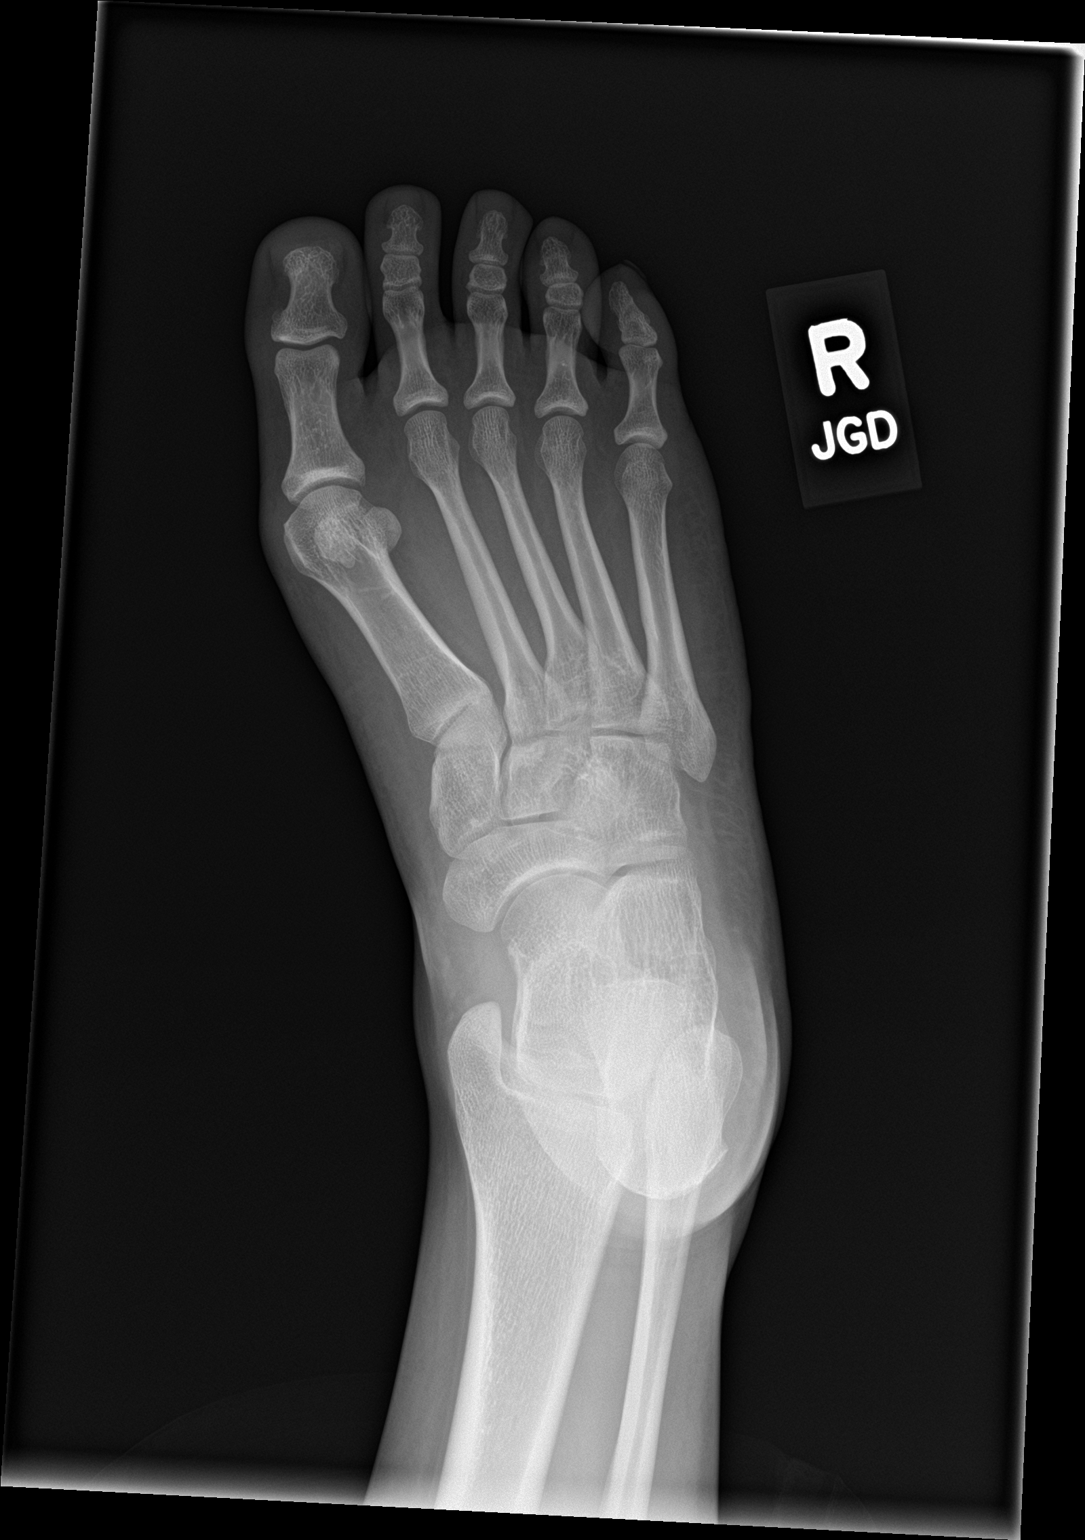

[foot obl]
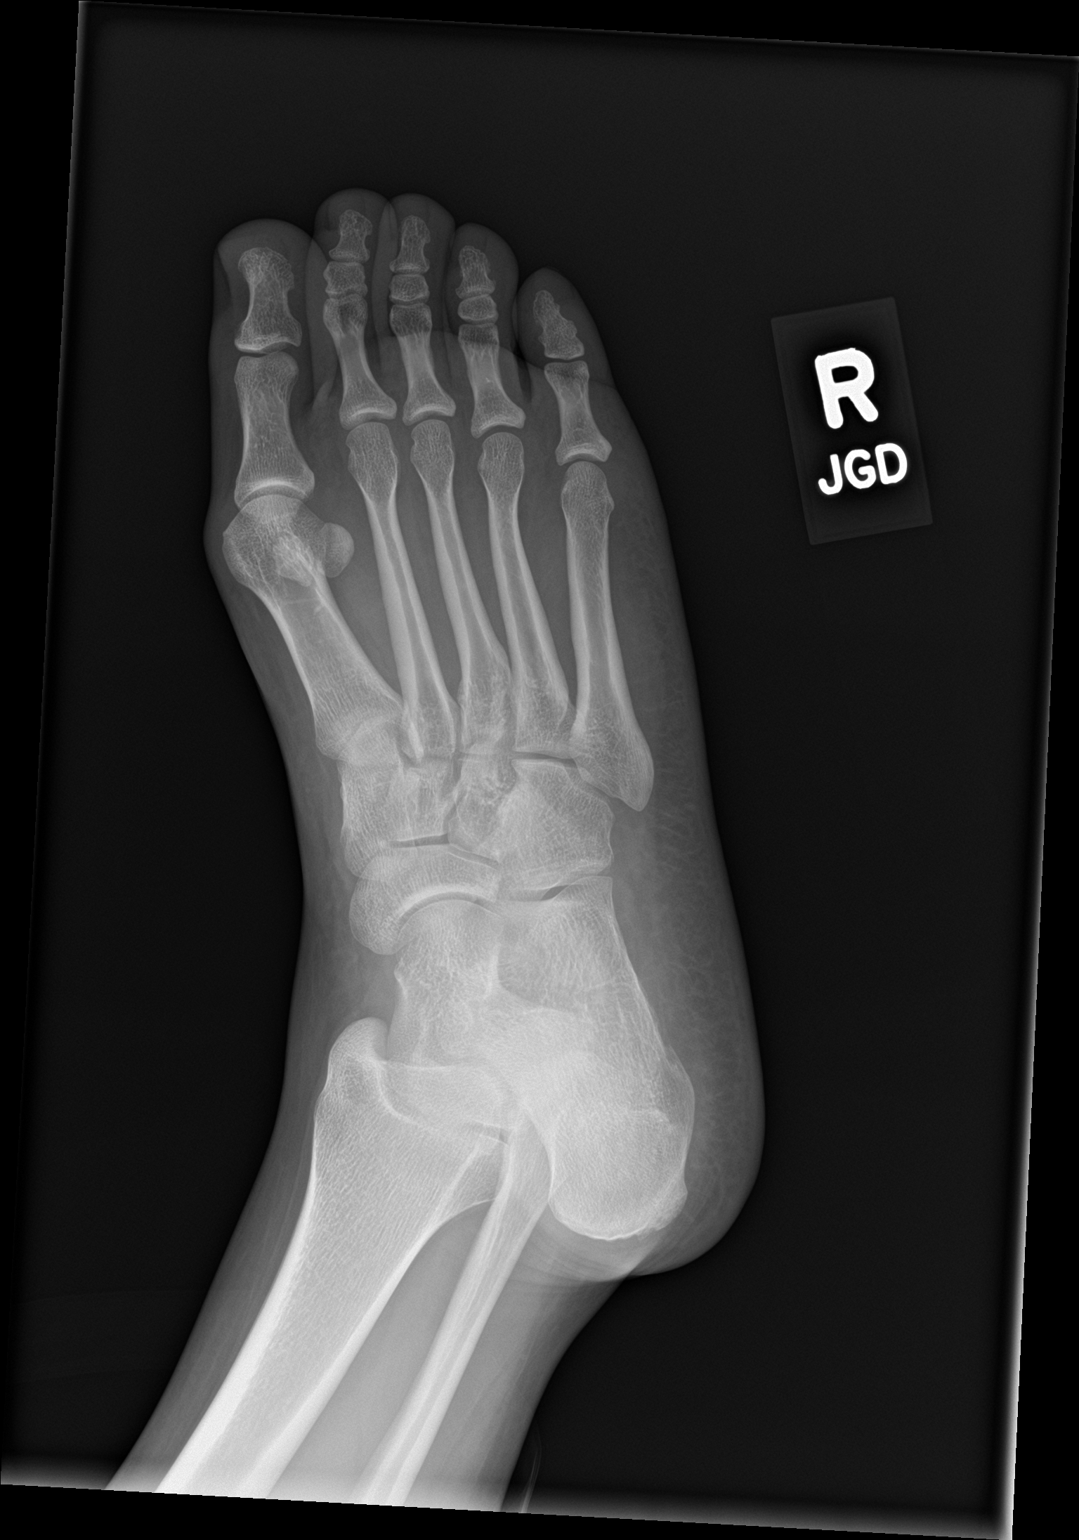

[foot lat]
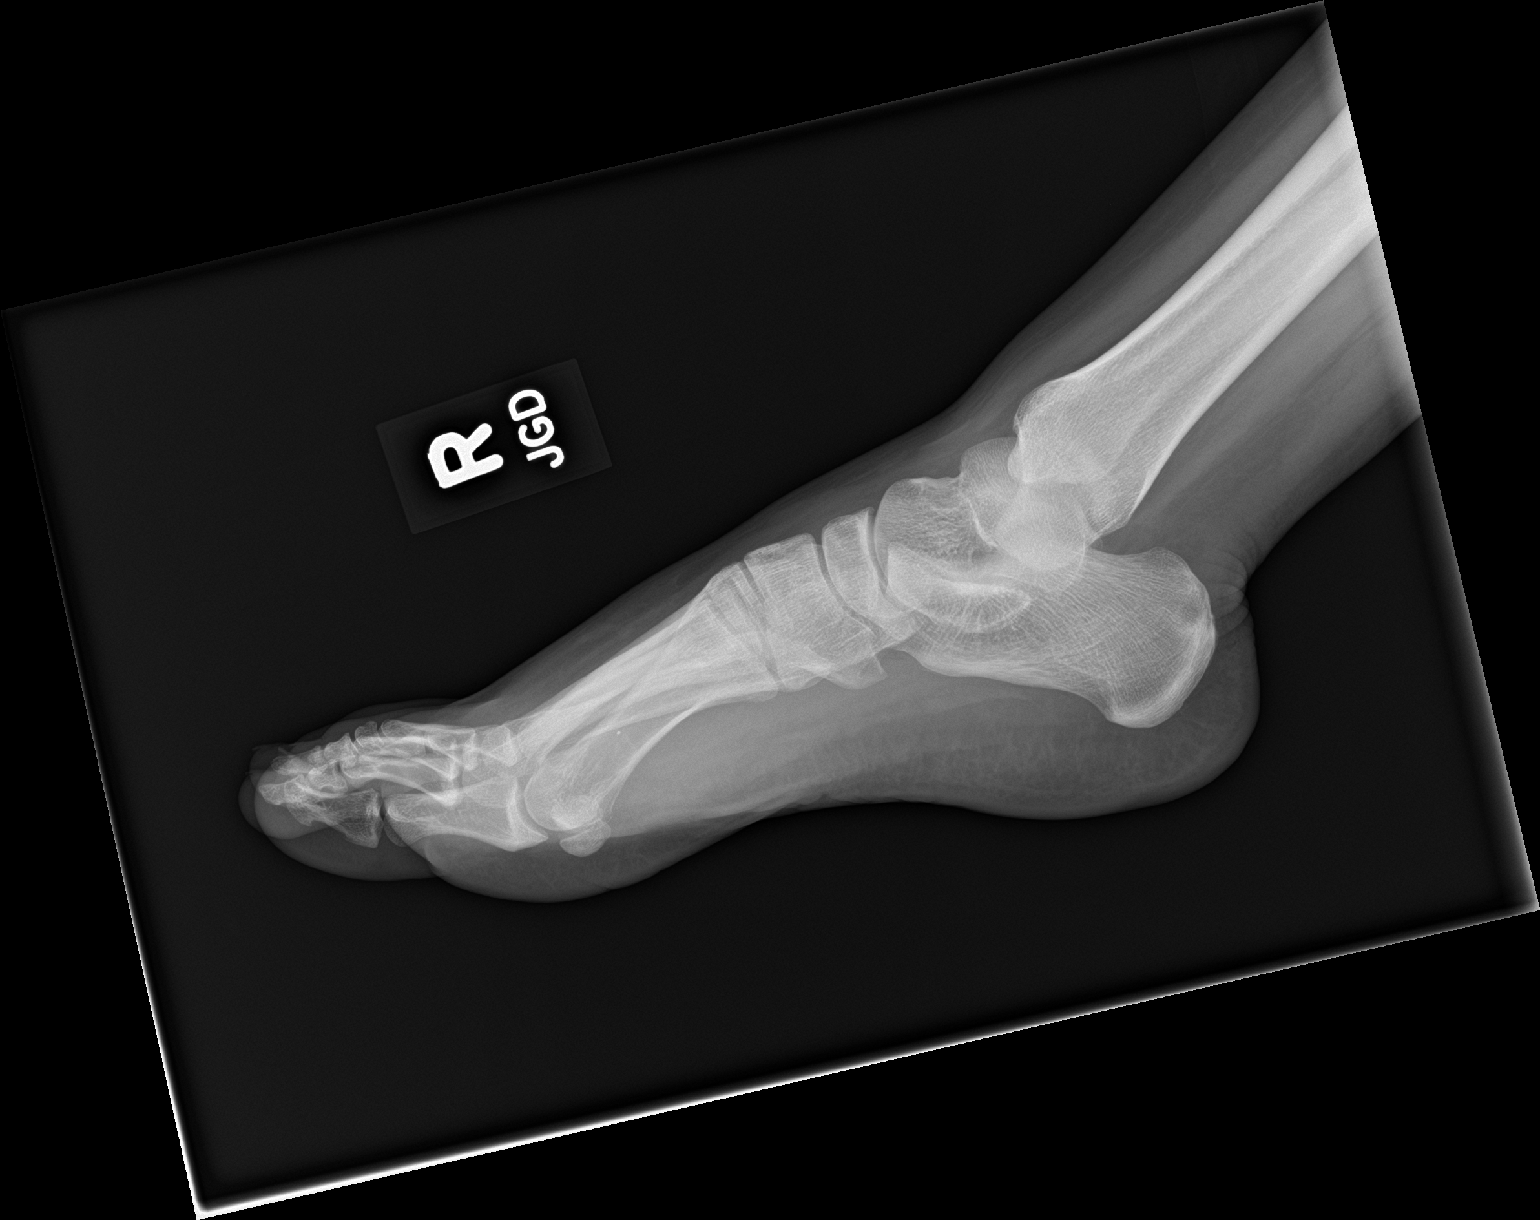

[3 of 3 positions shown; findings below may reference images not displayed]

FINDINGS: There is no evidence of fracture or dislocation. There is no
evidence of arthropathy or other focal bone abnormality. Soft
tissues are unremarkable.
IMPRESSION: Negative.

## 2022-01-02 ENCOUNTER — Encounter (HOSPITAL_COMMUNITY): Payer: Self-pay

## 2022-01-02 ENCOUNTER — Ambulatory Visit (HOSPITAL_COMMUNITY)
Admission: RE | Admit: 2022-01-02 | Discharge: 2022-01-02 | Disposition: A | Discharge status: Home / Self Care | Payer: 59 | Source: Ambulatory Visit | Attending: Emergency Medicine | Admitting: Emergency Medicine

## 2022-01-02 VITALS — BP 132/82 | HR 79 | Temp 97.8°F | Resp 18

## 2022-01-02 DIAGNOSIS — N898 Other specified noninflammatory disorders of vagina: Secondary | ICD-10-CM | POA: Diagnosis not present

## 2022-01-02 LAB — POCT URINALYSIS DIPSTICK, ED / UC
Bilirubin Urine: NEGATIVE
Glucose, UA: NEGATIVE mg/dL
Ketones, ur: NEGATIVE mg/dL
Nitrite: NEGATIVE
Protein, ur: 100 mg/dL — AB
Specific Gravity, Urine: 1.02 (ref 1.005–1.030)
Urobilinogen, UA: 2 mg/dL — ABNORMAL HIGH (ref 0.0–1.0)
pH: 8.5 — ABNORMAL HIGH (ref 5.0–8.0)

## 2022-01-02 LAB — POC URINE PREG, ED: Preg Test, Ur: NEGATIVE

## 2022-01-02 MED ORDER — METRONIDAZOLE 500 MG PO TABS: 500.0000 mg | ORAL_TABLET | Freq: Two times a day (BID) | ORAL | 0 refills | Status: DC

## 2022-01-02 NOTE — ED Provider Notes (Signed)
[facility]    CSN: [id number] Arrival date & time: 01/02/22  1232      History   Chief Complaint Chief Complaint  Patient presents with   Urinary Frequency   Dysuria    HPI Pamela Brown is a 21 y.o. female.   Patient presents with thin Pamela Brown vaginal discharge, urinary frequency, lower abdominal pressure, centralized back pain, dysuria and oliguria beginning 3 days ago.  Last menstrual period 12/11/2021, requesting pregnancy test.  Sexually active, 1 female partner, no condom use, no known exposure.  Has attempted use of cranberry pills and UTI symptom relief medication which has been ineffective.  Vaginal itching or odor, new rash or lesions, hematuria, fever or chills.  Past Medical History:  Diagnosis Date   Anemia    Asthma     There are no problems to display for this patient.   History reviewed. No pertinent surgical history.  OB History     Gravida  1   Para      Term      Preterm      AB  1   Living  0      SAB      IAB  1   Ectopic      Multiple      Live Births               Home Medications    Prior to Admission medications   Medication Sig Start Date End Date Taking? Authorizing Provider  acetaminophen (TYLENOL) 500 MG tablet Take 500 mg by mouth every 6 (six) hours as needed.    [provider]  ibuprofen (ADVIL) 800 MG tablet Take 1 tablet (800 mg total) by mouth 3 (three) times daily. 05/08/20   Wieters, Junius Creamer, PA-C    Family History History reviewed. No pertinent family history.  Social History Social History   Tobacco Use   Smoking status: Never   Smokeless tobacco: Never  Substance Use Topics   Alcohol use: Not Currently   Drug use: Not Currently     Allergies   Patient has no known allergies.   Review of Systems Review of Systems  Constitutional: Negative.   Respiratory: Negative.    Cardiovascular: Negative.   Genitourinary:  Positive for dysuria, frequency and vaginal discharge.  Negative for decreased urine volume, difficulty urinating, dyspareunia, enuresis, flank pain, genital sores, hematuria, menstrual problem, pelvic pain, urgency, vaginal bleeding and vaginal pain.  Skin: Negative.   Neurological: Negative.      Physical Exam Triage Vital Signs ED Triage Vitals  Enc Vitals Group     BP 01/02/22 1248 132/82     Pulse Rate 01/02/22 1248 79     Resp 01/02/22 1248 18     Temp 01/02/22 1248 97.8 F (36.6 C)     Temp Source 01/02/22 1248 Oral     SpO2 01/02/22 1248 100 %     Weight --      Height --      Head Circumference --      Peak Flow --      Pain Score 01/02/22 1247 6     Pain Loc --      Pain Edu? --      Excl. in GC? --    No data found.  Updated Vital Signs BP 132/82 (BP Location: Right Arm)   Pulse 79   Temp 97.8 F (36.6 C) (Oral)   Resp 18   LMP 12/11/2021 (Exact Date)  SpO2 100%   Visual Acuity Right Eye Distance:   Left Eye Distance:   Bilateral Distance:    Right Eye Near:   Left Eye Near:    Bilateral Near:     Physical Exam Constitutional:      Appearance: Normal appearance.  HENT:     Head: Normocephalic.  Eyes:     Extraocular Movements: Extraocular movements intact.  Pulmonary:     Effort: Pulmonary effort is normal.  Abdominal:     General: Abdomen is flat. Bowel sounds are normal.     Palpations: Abdomen is soft.     Tenderness: There is abdominal tenderness in the suprapubic area. There is no right CVA tenderness or left CVA tenderness.  Neurological:     Mental Status: She is alert and oriented to person, place, and time. Mental status is at baseline.  Psychiatric:        Mood and Affect: Mood normal.        Behavior: Behavior normal.      UC Treatments / Results  Labs (all labs ordered are listed, but only abnormal results are displayed) Labs Reviewed  POCT URINALYSIS DIPSTICK, ED / UC - Abnormal; Notable for the following components:      Result Value   Hgb urine dipstick MODERATE (*)     pH 8.5 (*)    Protein, ur 100 (*)    Urobilinogen, UA 2.0 (*)    Leukocytes,Ua MODERATE (*)    All other components within normal limits  POC URINE PREG, ED    EKG   Radiology No results found.  Procedures Procedures (including critical care time)  Medications Ordered in UC Medications - No data to display  Initial Impression / Assessment and Plan / UC Course  I have reviewed the triage vital signs and the nursing notes.  Pertinent labs & imaging results that were available during my care of the patient were reviewed by me and considered in my medical decision making (see chart for details).  Vaginal discharge  Mild suprapubic tenderness is noted, urine pregnancy is negative, urinalysis is negative, sent for culture, symptomology is consistent with bacterial vaginosis, will prophylactically treat, STI labs are pending, advised against alcohol use while taking medication, advised abstinence until all labs have resulted, treatment is complete and symptoms have resolved, advised condom use during all encounters moving forward, may follow-up with urgent care as needed Final Clinical Impressions(s) / UC Diagnoses   Final diagnoses:  None   Discharge Instructions   None    ED Prescriptions   None    PDMP not reviewed this encounter.   Valinda Hoar, NP 01/02/22 1329

## 2022-01-02 NOTE — Discharge Instructions (Signed)
Today you are being treated prophylactically for  Bacterial vaginosis   Take Metronidazole 500 mg twice a day for 7 days, do not drink alcohol while using medication, this will make you feel sick   Bacterial vaginosis which results from an overgrowth of one on several organisms that are normally present in your vagina. Vaginosis is an inflammation of the vagina that can result in discharge, itching and pain.  Pregnancy test is negative, if you have concerns about possible pregnancy you will need to wait to see if your next period starts, if not you may use a [employer] pregnancy test to check  Urinalysis had Dev Dhondt blood cells but did not show bacteria, it has been sent to the lab to determine if bacteria will grow, if this occurs you will be notified and antibiotics sent in for treatment   Labs pending 2-3 days, you will be contacted if positive for any sti and treatment will be sent to the pharmacy, you will have to return to the clinic if positive for gonorrhea to receive treatment   Please refrain from having sex until labs results, if positive please refrain from having sex until treatment complete and symptoms resolve   If positive for , Chlamydia  gonorrhea or trichomoniasis please notify partner or partners so they may tested as well  Moving forward, it is recommended you use some form of protection against the transmission of sti infections  such as condoms or dental dams with each sexual encounter     In addition: Avoid baths, hot tubs and whirlpool spas.  Don't use scented or harsh soaps Avoid irritants. These include scented tampons and pads. Wipe from front to back after using the toilet. Don't douche. Your vagina doesn't require cleansing other than normal bathing.  Use a condom.  Wear cotton underwear, this fabric absorbs some moisture.

## 2022-01-02 NOTE — ED Triage Notes (Signed)
Pt reports urinary frequency, oliguria, and pain sensation with urination x 2/3 days. States she believes she may have an UTI.   Also endorses abdominal cramping and cramping in the lower back. Requesting pregnancy test. LMP 12/11/21. States normally last 7 days and was only 5 days this time.

## 2022-01-03 LAB — CERVICOVAGINAL ANCILLARY ONLY
Bacterial Vaginitis (gardnerella): POSITIVE — AB
Candida Glabrata: NEGATIVE
Candida Vaginitis: POSITIVE — AB
Chlamydia: NEGATIVE
Comment: NEGATIVE
Comment: NEGATIVE
Comment: NEGATIVE
Comment: NEGATIVE
Comment: NEGATIVE
Comment: NORMAL
Neisseria Gonorrhea: NEGATIVE
Trichomonas: NEGATIVE

## 2022-01-04 ENCOUNTER — Telehealth (HOSPITAL_COMMUNITY): Payer: Self-pay | Admitting: Emergency Medicine

## 2022-01-04 LAB — URINE CULTURE: Culture: >=100000 — AB

## 2022-01-04 MED ORDER — NITROFURANTOIN MONOHYD MACRO 100 MG PO CAPS: 100.0000 mg | ORAL_CAPSULE | Freq: Two times a day (BID) | ORAL | 0 refills | Status: DC

## 2022-01-04 MED ORDER — FLUCONAZOLE 150 MG PO TABS: 150.0000 mg | ORAL_TABLET | Freq: Once | ORAL | 0 refills | Status: AC

## 2022-02-12 ENCOUNTER — Ambulatory Visit (HOSPITAL_COMMUNITY)
Admission: EM | Admit: 2022-02-12 | Discharge: 2022-02-12 | Disposition: A | Discharge status: Home / Self Care | Payer: Medicaid - Out of State | Attending: Internal Medicine | Admitting: Internal Medicine

## 2022-02-12 ENCOUNTER — Encounter (HOSPITAL_COMMUNITY): Payer: Self-pay

## 2022-02-12 DIAGNOSIS — Z20822 Contact with and (suspected) exposure to covid-19: Secondary | ICD-10-CM | POA: Insufficient documentation

## 2022-02-12 DIAGNOSIS — R519 Headache, unspecified: Secondary | ICD-10-CM | POA: Diagnosis not present

## 2022-02-12 DIAGNOSIS — Z87440 Personal history of urinary (tract) infections: Secondary | ICD-10-CM | POA: Insufficient documentation

## 2022-02-12 DIAGNOSIS — B349 Viral infection, unspecified: Secondary | ICD-10-CM | POA: Insufficient documentation

## 2022-02-12 DIAGNOSIS — Z3202 Encounter for pregnancy test, result negative: Secondary | ICD-10-CM | POA: Diagnosis not present

## 2022-02-12 DIAGNOSIS — R350 Frequency of micturition: Secondary | ICD-10-CM | POA: Insufficient documentation

## 2022-02-12 LAB — POCT URINALYSIS DIPSTICK, ED / UC
Glucose, UA: NEGATIVE mg/dL
Ketones, ur: 15 mg/dL — AB
Leukocytes,Ua: NEGATIVE
Nitrite: NEGATIVE
Protein, ur: NEGATIVE mg/dL
Specific Gravity, Urine: 1.025 (ref 1.005–1.030)
Urobilinogen, UA: >=8 mg/dL (ref 0.0–1.0)
pH: 5.5 (ref 5.0–8.0)

## 2022-02-12 LAB — SARS CORONAVIRUS 2 BY RT PCR: SARS Coronavirus 2 by RT PCR: NEGATIVE

## 2022-02-12 LAB — POC URINE PREG, ED: Preg Test, Ur: NEGATIVE

## 2022-02-12 LAB — POCT RAPID STREP A, ED / UC: Streptococcus, Group A Screen (Direct): NEGATIVE

## 2022-02-12 MED ORDER — ACETAMINOPHEN 325 MG PO TABS
ORAL_TABLET | ORAL | Status: AC
  Filled 2022-02-12: qty 3

## 2022-02-12 MED ORDER — ACETAMINOPHEN 325 MG PO TABS
975.0000 mg | ORAL_TABLET | Freq: Once | ORAL | Status: AC
  Administered 2022-02-12: 975 mg via ORAL

## 2022-02-12 MED ORDER — ACETAMINOPHEN 325 MG PO TABS
ORAL_TABLET | ORAL | Status: AC
  Filled 2022-02-12: qty 1

## 2022-02-12 MED ORDER — IBUPROFEN 600 MG PO TABS: 600.0000 mg | ORAL_TABLET | Freq: Four times a day (QID) | ORAL | 0 refills | Status: DC | PRN

## 2022-02-12 MED ORDER — ACETAMINOPHEN 500 MG PO TABS: 1000.0000 mg | ORAL_TABLET | Freq: Four times a day (QID) | ORAL | 0 refills | Status: DC | PRN

## 2022-02-12 MED ORDER — KETOROLAC TROMETHAMINE 30 MG/ML IJ SOLN
INTRAMUSCULAR | Status: AC
  Filled 2022-02-12: qty 1

## 2022-02-12 MED ORDER — ONDANSETRON 4 MG PO TBDP: 4.0000 mg | ORAL_TABLET | Freq: Three times a day (TID) | ORAL | 0 refills | Status: DC | PRN

## 2022-02-12 MED ORDER — KETOROLAC TROMETHAMINE 30 MG/ML IJ SOLN
30.0000 mg | Freq: Once | INTRAMUSCULAR | Status: AC
Start: 2022-02-12 — End: 2022-02-12
  Administered 2022-02-12: 30 mg via INTRAMUSCULAR

## 2022-02-12 NOTE — Discharge Instructions (Addendum)
You have a viral upper respiratory infection.  Your COVID testing is pending.  We will call you if your test is positive.  If it is negative, you will not hear from [place]. Your strep testing is negative in the clinic.  I have sent your throat swab for culture.  We will call you if it comes back positive requiring antibiotics.  You may take Zofran 4 mg every 8 hours as needed for nausea.  Eat a bland diet over the next 12 to 24 hours and increase your water intake as your urinalysis shows that you are mildly dehydrated.  Drink at least 8 cups of water per day.  You may take tylenol 1,000mg  and ibuprofen 600mg  every 6 hours with food as needed for fever/chills, sore throat, aches/pains, and inflammation associated with viral illness. Take this with food to avoid stomach upset.    Your next dose of tylenol may be at 6pm tonight.  Your next dose of ibuprofen may be at 9pm tonight.   You may do salt water and baking soda gargles every 4 hours as needed for your throat pain.  Please put 1 teaspoon of salt and 1/2 teaspoon of baking soda in 8 ounces of warm water then gargle and spit the water out. You may also put 1 tablespoon of honey in warm water and drink this to soothe your throat.  Place a humidifier in your room at night to help decrease dry air that can irritate your airway and cause you to have a sore throat and cough.  Please try to eat a well-balanced diet while you are sick so that your body gets proper nutrition to heal.  If you develop any new or worsening symptoms, please return.  If your symptoms are severe, please go to the emergency room.  Follow-up with your primary care provider for further evaluation and management of your symptoms as well as ongoing wellness visits.  I hope you feel better!

## 2022-02-12 NOTE — ED Provider Notes (Signed)
[facility]    CSN: [id number] Arrival date & time: 02/12/22  1008      History   Chief Complaint Chief Complaint  Patient presents with   Fever    Pt is here for fever, sore throat and headache x2days    HPI Pamela Brown is a 21 y.o. female.   Patient presents to urgent care for evaluation of fever, sore throat, and headache that started yesterday.  States her fever was 103 at home yesterday.  She took Tylenol over-the-counter for fever with some relief.  Last dose of Tylenol was this morning at around 3 AM.  Patient reports fever and chills overnight and states that she slept with a heating pad for comfort.  Throat pain is worse with swallowing.  Headache is generalized and currently a 10 on a scale of 0-10.  No blurry vision or decreased visual acuity reported.  Patient describes headache as an achy throbbing sensation.  Patient denies cough, nasal congestion, ear pain, eye drainage, neck pain, and back pain.  Reports nausea but no vomiting.  She is having generalized abdominal discomfort but denies diarrhea and constipation.  Last normal bowel movement was yesterday.  Reports urinary frequency but denies dysuria, urgency, and suprapubic discomfort.  States she was recently treated for a urinary tract infection a couple of weeks ago.  She is not currently nauseous and has been able to keep food down without difficulty.  States she works at [employer] and may have been around someone sick there but does not know of any sick contacts otherwise.  Patient has attempted use of over-the-counter NyQuil and another cough syrup that she cannot remember the name of from [facility] without much relief of symptoms.  Also states that her menstrual cycle is late and she is unsure of pregnancy status.  No recent new sexual partners or known exposure to STIs.  No vaginal symptoms reported.  She is not a smoker and denies history of chronic respiratory problems.  No other  aggravating or relieving factors identified at this time for patient's symptoms.   Fever   Past Medical History:  Diagnosis Date   Anemia    Asthma     There are no problems to display for this patient.   History reviewed. No pertinent surgical history.  OB History     Gravida  1   Para      Term      Preterm      AB  1   Living  0      SAB      IAB  1   Ectopic      Multiple      Live Births               Home Medications    Prior to Admission medications   Medication Sig Start Date End Date Taking? Authorizing Provider  ibuprofen (ADVIL) 600 MG tablet Take 1 tablet (600 mg total) by mouth every 6 (six) hours as needed. 02/12/22  Yes Carlisle Beers, FNP  ondansetron (ZOFRAN-ODT) 4 MG disintegrating tablet Take 1 tablet (4 mg total) by mouth every 8 (eight) hours as needed for nausea or vomiting. 02/12/22  Yes Carlisle Beers, FNP  acetaminophen (TYLENOL) 500 MG tablet Take 2 tablets (1,000 mg total) by mouth every 6 (six) hours as needed. 02/12/22   Carlisle Beers, FNP  metroNIDAZOLE (FLAGYL) 500 MG tablet Take 1 tablet (500 mg total) by mouth  2 (two) times daily. 01/02/22   White, Elita BooneAdrienne R, NP  nitrofurantoin, macrocrystal-monohydrate, (MACROBID) 100 MG capsule Take 1 capsule (100 mg total) by mouth 2 (two) times daily. 01/04/22   Lamptey, Britta MccreedyPhilip O, MD    Family History History reviewed. No pertinent family history.  Social History Social History   Tobacco Use   Smoking status: Never   Smokeless tobacco: Never  Substance Use Topics   Alcohol use: Not Currently   Drug use: Not Currently     Allergies   Patient has no known allergies.   Review of Systems Review of Systems  Constitutional:  Positive for fever.  Per HPI   Physical Exam Triage Vital Signs ED Triage Vitals  Enc Vitals Group     BP 02/12/22 1049 114/64     Pulse Rate 02/12/22 1049 98     Resp 02/12/22 1049 16     Temp --      Temp src --       SpO2 02/12/22 1049 99 %     Weight 02/12/22 1047 175 lb (79.4 kg)     Height 02/12/22 1047 4\' 11"  (1.499 m)     Head Circumference --      Peak Flow --      Pain Score 02/12/22 1047 10     Pain Loc --      Pain Edu? --      Excl. in GC? --    No data found.  Updated Vital Signs BP 114/64 (BP Location: Right Arm)   Pulse 98   Temp 98.3 F (36.8 C) (Oral)   Resp 16   Ht 4\' 11"  (1.499 m)   Wt 175 lb (79.4 kg)   LMP 01/16/2022   SpO2 99%   BMI 35.35 kg/m   Visual Acuity Right Eye Distance:   Left Eye Distance:   Bilateral Distance:    Right Eye Near:   Left Eye Near:    Bilateral Near:     Physical Exam Vitals and nursing note reviewed.  Constitutional:      Appearance: Normal appearance. She is ill-appearing. She is not toxic-appearing.     Comments: Very pleasant patient sitting on exam in position of comfort table in no acute distress.   HENT:     Head: Normocephalic and atraumatic.     Right Ear: Hearing, tympanic membrane, ear canal and external ear normal.     Left Ear: Hearing, tympanic membrane, ear canal and external ear normal.     Nose: Nose normal. No congestion.     Right Turbinates: Swollen.     Left Turbinates: Swollen.     Mouth/Throat:     Lips: Pink.     Mouth: Mucous membranes are moist.     Pharynx: Uvula midline. Posterior oropharyngeal erythema present. No pharyngeal swelling or oropharyngeal exudate.     Comments: Mild erythema to posterior oropharynx with small amount of clear postnasal drainage visualized. Airway intact and patent. Eyes:     General: Lids are normal. Vision grossly intact. Gaze aligned appropriately.     Extraocular Movements: Extraocular movements intact.     Conjunctiva/sclera: Conjunctivae normal.  Cardiovascular:     Rate and Rhythm: Normal rate and regular rhythm.     Heart sounds: Normal heart sounds, S1 normal and S2 normal.  Pulmonary:     Effort: Pulmonary effort is normal. No respiratory distress.     Breath  sounds: Normal breath sounds and air entry.     Comments:  Clear to auscultation bilaterally. Abdominal:     General: Bowel sounds are normal.     Palpations: Abdomen is soft.     Tenderness: There is no abdominal tenderness. There is no right CVA tenderness, left CVA tenderness or guarding.     Comments: No peritoneal signs.  Musculoskeletal:     Cervical back: Normal range of motion and neck supple.  Lymphadenopathy:     Cervical: Cervical adenopathy present.  Skin:    General: Skin is warm and dry.     Capillary Refill: Capillary refill takes less than 2 seconds.     Findings: No rash.  Neurological:     General: No focal deficit present.     Mental Status: She is alert and oriented to person, place, and time. Mental status is at baseline.     Cranial Nerves: No dysarthria or facial asymmetry.     Gait: Gait is intact.     Comments: 5/5 power throughout.  No focal deficit identified.  Psychiatric:        Mood and Affect: Mood normal.        Speech: Speech normal.        Behavior: Behavior normal.        Thought Content: Thought content normal.        Judgment: Judgment normal.      UC Treatments / Results  Labs (all labs ordered are listed, but only abnormal results are displayed) Labs Reviewed  POCT URINALYSIS DIPSTICK, ED / UC - Abnormal; Notable for the following components:      Result Value   Bilirubin Urine SMALL (*)    Ketones, ur 15 (*)    Hgb urine dipstick TRACE (*)    All other components within normal limits  CULTURE, GROUP A STREP ([facility])  SARS CORONAVIRUS 2 BY RT PCR  POCT RAPID STREP A, ED / UC  POC URINE PREG, ED    EKG   Radiology No results found.  Procedures Procedures (including critical care time)  Medications Ordered in UC Medications  ketorolac (TORADOL) 30 MG/ML injection 30 mg (30 mg Intramuscular Given 02/12/22 1207)  acetaminophen (TYLENOL) tablet 975 mg (975 mg Oral Given 02/12/22 1207)    Initial Impression / Assessment and  Plan / UC Course  I have reviewed the triage vital signs and the nursing notes.  Pertinent labs & imaging results that were available during my care of the patient were reviewed by me and considered in my medical decision making (see chart for details).   1.  Viral illness Group A strep testing is negative.  Throat culture pending.  COVID-19 testing is pending.  Patient is a candidate for antiviral therapy as her symptoms started 2 days ago.  We will call patient if COVID-19 testing is positive requiring antiviral treatment.  Otherwise, we will treat this with conservative care prescriptions for symptomatic improvement.  She may use Zofran 4 mg every 8 hours as needed for nausea.  Advised patient to eat a bland diet over the next 12 to 24 hours and increase water intake to maintain hydration.  Tylenol 1000 mg and ibuprofen 600 mg may be used every 6 hours as needed for fever, chills, sore throat, and headache.  No ibuprofen until 9 PM tonight and no Tylenol till 6 PM tonight due to dose of Tylenol in clinic and ketorolac injection in clinic given for headache.  Nonpharmacologic methods of soothing throat pain given in AVS including salt water and baking soda gargles and  warm tea with honey and lemon.  Advised patient to return to urgent care if she develops any new or worsening symptoms.  Deferred imaging based on stable cardiopulmonary exam and vital signs at this time.  Patient agreeable with plan.   2.  Negative pregnancy test and urinary frequency Urinalysis negative for urinary tract infection but shows mild dehydration.  Advised patient as stated above to increase water intake to at least 8 cups of water per day to maintain hydration.  Pregnancy test is negative.   Discussed physical exam and available lab work findings in clinic with patient.  Counseled patient regarding appropriate use of medications and potential side effects for all medications recommended or prescribed today. Discussed red flag  signs and symptoms of worsening condition,when to call the PCP office, return to urgent care, and when to seek higher level of care in the emergency department. Patient verbalizes understanding and agreement with plan. All questions answered. Patient discharged in stable condition.  Final Clinical Impressions(s) / UC Diagnoses   Final diagnoses:  Viral illness  Negative pregnancy test  Urinary frequency  Bad headache     Discharge Instructions      You have a viral upper respiratory infection.  Your COVID testing is pending.  We will call you if your test is positive.  If it is negative, you will not hear from [place]. Your strep testing is negative in the clinic.  I have sent your throat swab for culture.  We will call you if it comes back positive requiring antibiotics.  You may take Zofran 4 mg every 8 hours as needed for nausea.  Eat a bland diet over the next 12 to 24 hours and increase your water intake as your urinalysis shows that you are mildly dehydrated.  Drink at least 8 cups of water per day.  You may take tylenol 1,000mg  and ibuprofen 600mg  every 6 hours with food as needed for fever/chills, sore throat, aches/pains, and inflammation associated with viral illness. Take this with food to avoid stomach upset.    Your next dose of tylenol may be at 6pm tonight.  Your next dose of ibuprofen may be at 9pm tonight.   You may do salt water and baking soda gargles every 4 hours as needed for your throat pain.  Please put 1 teaspoon of salt and 1/2 teaspoon of baking soda in 8 ounces of warm water then gargle and spit the water out. You may also put 1 tablespoon of honey in warm water and drink this to soothe your throat.  Place a humidifier in your room at night to help decrease dry air that can irritate your airway and cause you to have a sore throat and cough.  Please try to eat a well-balanced diet while you are sick so that your body gets proper nutrition to heal.  If you develop  any new or worsening symptoms, please return.  If your symptoms are severe, please go to the emergency room.  Follow-up with your primary care provider for further evaluation and management of your symptoms as well as ongoing wellness visits.  I hope you feel better!      ED Prescriptions     Medication Sig Dispense Auth. Provider   ondansetron (ZOFRAN-ODT) 4 MG disintegrating tablet Take 1 tablet (4 mg total) by mouth every 8 (eight) hours as needed for nausea or vomiting. 20 tablet , FNP   acetaminophen (TYLENOL) 500 MG tablet Take 2 tablets (1,000 mg total)  by mouth every 6 (six) hours as needed. 30 tablet Reita May M, FNP   ibuprofen (ADVIL) 600 MG tablet Take 1 tablet (600 mg total) by mouth every 6 (six) hours as needed. 30 tablet Carlisle Beers, FNP      PDMP not reviewed this encounter.   Carlisle Beers, [place] 02/12/22 1219

## 2022-02-12 NOTE — ED Triage Notes (Signed)
Pt has fever sore throat , and headache x2days . Pt denies vomiting

## 2022-02-15 LAB — CULTURE, GROUP A STREP (THRC)

## 2022-02-16 ENCOUNTER — Telehealth (HOSPITAL_COMMUNITY): Payer: Self-pay | Admitting: Emergency Medicine

## 2022-02-16 MED ORDER — AMOXICILLIN 500 MG PO CAPS: 500.0000 mg | ORAL_CAPSULE | Freq: Two times a day (BID) | ORAL | 0 refills | Status: AC

## 2022-05-27 ENCOUNTER — Inpatient Hospital Stay (HOSPITAL_COMMUNITY)
Admission: AD | Admit: 2022-05-27 | Discharge: 2022-05-27 | Disposition: A | Discharge status: Home / Self Care | Payer: 59 | Attending: Obstetrics and Gynecology | Admitting: Obstetrics and Gynecology

## 2022-05-27 ENCOUNTER — Inpatient Hospital Stay (HOSPITAL_COMMUNITY): Payer: 59

## 2022-05-27 ENCOUNTER — Encounter (HOSPITAL_COMMUNITY): Payer: Self-pay | Admitting: *Deleted

## 2022-05-27 DIAGNOSIS — B9689 Other specified bacterial agents as the cause of diseases classified elsewhere: Secondary | ICD-10-CM | POA: Diagnosis not present

## 2022-05-27 DIAGNOSIS — O26851 Spotting complicating pregnancy, first trimester: Secondary | ICD-10-CM | POA: Insufficient documentation

## 2022-05-27 DIAGNOSIS — R1032 Left lower quadrant pain: Secondary | ICD-10-CM | POA: Diagnosis not present

## 2022-05-27 DIAGNOSIS — Z3A01 Less than 8 weeks gestation of pregnancy: Secondary | ICD-10-CM | POA: Insufficient documentation

## 2022-05-27 DIAGNOSIS — O26891 Other specified pregnancy related conditions, first trimester: Secondary | ICD-10-CM | POA: Insufficient documentation

## 2022-05-27 DIAGNOSIS — O23591 Infection of other part of genital tract in pregnancy, first trimester: Secondary | ICD-10-CM | POA: Diagnosis not present

## 2022-05-27 DIAGNOSIS — O209 Hemorrhage in early pregnancy, unspecified: Secondary | ICD-10-CM

## 2022-05-27 HISTORY — DX: Urinary tract infection, site not specified: N39.0

## 2022-05-27 HISTORY — DX: Headache, unspecified: R51.9

## 2022-05-27 LAB — COMPREHENSIVE METABOLIC PANEL
ALT: 14 U/L (ref 0–44)
AST: 15 U/L (ref 15–41)
Albumin: 4.1 g/dL (ref 3.5–5.0)
Alkaline Phosphatase: 45 U/L (ref 38–126)
Anion gap: 4 — ABNORMAL LOW (ref 5–15)
BUN: 8 mg/dL (ref 6–20)
CO2: 24 mmol/L (ref 22–32)
Calcium: 9.4 mg/dL (ref 8.9–10.3)
Chloride: 108 mmol/L (ref 98–111)
Creatinine, Ser: 0.8 mg/dL (ref 0.44–1.00)
GFR, Estimated: >60 mL/min (ref >60)
Glucose, Bld: 95 mg/dL (ref 70–99)
Potassium: 4 mmol/L (ref 3.5–5.1)
Sodium: 136 mmol/L (ref 135–145)
Total Bilirubin: 0.3 mg/dL (ref 0.3–1.2)
Total Protein: 7.5 g/dL (ref 6.5–8.1)

## 2022-05-27 LAB — WET PREP, GENITAL
Sperm: NONE SEEN
Trich, Wet Prep: NONE SEEN
WBC, Wet Prep HPF POC: <10 (ref <10)
Yeast Wet Prep HPF POC: NONE SEEN

## 2022-05-27 LAB — CBC WITH DIFFERENTIAL/PLATELET
Abs Immature Granulocytes: 0.03 10*3/uL (ref 0.00–0.07)
Basophils Absolute: 0 10*3/uL (ref 0.0–0.1)
Basophils Relative: 1 %
Eosinophils Absolute: 0 10*3/uL (ref 0.0–0.5)
Eosinophils Relative: 0 %
HCT: 34.3 % — ABNORMAL LOW (ref 36.0–46.0)
Hemoglobin: 11.4 g/dL — ABNORMAL LOW (ref 12.0–15.0)
Immature Granulocytes: 0 %
Lymphocytes Relative: 22 %
Lymphs Abs: 1.7 10*3/uL (ref 0.7–4.0)
MCH: 26.5 pg (ref 26.0–34.0)
MCHC: 33.2 g/dL (ref 30.0–36.0)
MCV: 79.6 fL — ABNORMAL LOW (ref 80.0–100.0)
Monocytes Absolute: 0.5 10*3/uL (ref 0.1–1.0)
Monocytes Relative: 7 %
Neutro Abs: 5.4 10*3/uL (ref 1.7–7.7)
Neutrophils Relative %: 70 %
Platelets: 341 10*3/uL (ref 150–400)
RBC: 4.31 MIL/uL (ref 3.87–5.11)
RDW: 13.4 % (ref 11.5–15.5)
WBC: 7.7 10*3/uL (ref 4.0–10.5)
nRBC: 0 % (ref 0.0–0.2)

## 2022-05-27 LAB — URINALYSIS, ROUTINE W REFLEX MICROSCOPIC
Bilirubin Urine: NEGATIVE
Glucose, UA: NEGATIVE mg/dL
Ketones, ur: NEGATIVE mg/dL
Nitrite: NEGATIVE
Protein, ur: NEGATIVE mg/dL
Specific Gravity, Urine: 1.025 (ref 1.005–1.030)
pH: 5 (ref 5.0–8.0)

## 2022-05-27 LAB — HIV ANTIBODY (ROUTINE TESTING W REFLEX): HIV Screen 4th Generation wRfx: NONREACTIVE

## 2022-05-27 LAB — HCG, QUANTITATIVE, PREGNANCY: hCG, Beta Chain, Quant, S: 10176 m[IU]/mL — ABNORMAL HIGH (ref <5)

## 2022-05-27 LAB — ABO/RH: ABO/RH(D): O POS

## 2022-05-27 LAB — POCT PREGNANCY, URINE: Preg Test, Ur: POSITIVE — AB

## 2022-05-27 MED ORDER — METRONIDAZOLE 500 MG PO TABS: 500.0000 mg | ORAL_TABLET | Freq: Two times a day (BID) | ORAL | 0 refills | Status: AC

## 2022-05-27 NOTE — MAU Note (Signed)
Pamela Brown is a 21 y.o. at Unknown here in MAU reporting: +HPT 2 days.   Had some bleeding yesterday, small amt- red.  None today.  Had an abortion in 2017, has been trying to get pregnant since then, thought something was maybe wrong.  Having some mild cramping in LLQ. Been having cramps since before she missed her period.  LMP: 10/20 Onset of complaint: bleeding yesterday, cramping int for 5-6wks Pain score: mild Vitals:   05/27/22 1214  BP: 122/60  Pulse: 73  Resp: 17  Temp: 98.3 F (36.8 C)  SpO2: 99%     Lab orders placed from triage: UPT, UA if +    Has appt on 12/13

## 2022-05-27 NOTE — MAU Provider Note (Signed)
History     CSN: [id number]  Arrival date and time: 05/27/22 1156   Event Date/Time   First Provider Initiated Contact with Patient 05/27/22 1410      Chief Complaint  Patient presents with   Vaginal Bleeding   Possible Pregnancy   Abdominal Pain   HPI  Pamela Brown is a 21 y.o. female G2P0010 @ [redacted]w[redacted]d here in MAU with vaginal bleeding. She reports the bright vaginal bleeding started yesterday. It is a small amount. It is much less than a period. She has some LLQ pain that comes and goes. She would like to do STI testing today. She has a certain LMP.   OB History     Gravida  2   Para      Term      Preterm      AB  1   Living  0      SAB      IAB  1   Ectopic      Multiple      Live Births              Past Medical History:  Diagnosis Date   Anemia    Asthma    Headache    UTI (urinary tract infection)     Past Surgical History:  Procedure Laterality Date   THERAPEUTIC ABORTION      Family History  Problem Relation Age of Onset   Heart disease Mother    Other Father        unknown    Social History   Tobacco Use   Smoking status: Former    Types: Cigars   Smokeless tobacco: Never   Tobacco comments:    Quit with +pareg  Vaping Use   Vaping Use: Former  Substance Use Topics   Alcohol use: Not Currently   Drug use: Not Currently    Types: Marijuana    Comment: last 3rd wk inNov    Allergies: No Known Allergies  Medications Prior to Admission  Medication Sig Dispense Refill Last Dose   ibuprofen (ADVIL) 600 MG tablet Take 1 tablet (600 mg total) by mouth every 6 (six) hours as needed. 30 tablet 0 05/27/2022   acetaminophen (TYLENOL) 500 MG tablet Take 2 tablets (1,000 mg total) by mouth every 6 (six) hours as needed. (Patient taking differently: Take 1,000 mg by mouth every 6 (six) hours as needed.) 30 tablet 0    albuterol (PROVENTIL) (5 MG/ML) 0.5% nebulizer solution Take 2.5 mg by nebulization every 6 (six) hours as  needed for wheezing or shortness of breath.   More than a month   metroNIDAZOLE (FLAGYL) 500 MG tablet Take 1 tablet (500 mg total) by mouth 2 (two) times daily. 14 tablet 0    nitrofurantoin, macrocrystal-monohydrate, (MACROBID) 100 MG capsule Take 1 capsule (100 mg total) by mouth 2 (two) times daily. 10 capsule 0    ondansetron (ZOFRAN-ODT) 4 MG disintegrating tablet Take 1 tablet (4 mg total) by mouth every 8 (eight) hours as needed for nausea or vomiting. 20 tablet 0    Results for orders placed or performed during the hospital encounter of 05/27/22 (from the past 48 hour(s))  Pregnancy, urine POC     Status: Abnormal   Collection Time: 05/27/22  1:02 PM  Result Value Ref Range   Preg Test, Ur POSITIVE (A) NEGATIVE    Comment:        THE SENSITIVITY OF THIS METHODOLOGY IS >24 mIU/mL  Urinalysis, Routine w reflex microscopic Urine, Clean Catch     Status: Abnormal   Collection Time: 05/27/22  1:05 PM  Result Value Ref Range   Color, Urine YELLOW YELLOW   APPearance HAZY (A) CLEAR   Specific Gravity, Urine 1.025 1.005 - 1.030   pH 5.0 5.0 - 8.0   Glucose, UA NEGATIVE NEGATIVE mg/dL   Hgb urine dipstick SMALL (A) NEGATIVE   Bilirubin Urine NEGATIVE NEGATIVE   Ketones, ur NEGATIVE NEGATIVE mg/dL   Protein, ur NEGATIVE NEGATIVE mg/dL   Nitrite NEGATIVE NEGATIVE   Leukocytes,Ua SMALL (A) NEGATIVE   RBC / HPF 0-5 0 - 5 RBC/hpf   WBC, UA 6-10 0 - 5 WBC/hpf   Bacteria, UA RARE (A) NONE SEEN   Squamous Epithelial / LPF 6-10 0 - 5   Mucus PRESENT     Comment: Performed at [facility], [address], [place], [place] [address]  CBC with Differential/Platelet     Status: Abnormal   Collection Time: 05/27/22  2:30 PM  Result Value Ref Range   WBC 7.7 4.0 - 10.5 K/uL   RBC 4.31 3.87 - 5.11 MIL/uL   Hemoglobin 11.4 (L) 12.0 - 15.0 g/dL   HCT 84.1 (L) 32.4 - 40.1 %   MCV 79.6 (L) 80.0 - 100.0 fL   MCH 26.5 26.0 - 34.0 pg   MCHC 33.2 30.0 - 36.0 g/dL   RDW 02.7 25.3 - 66.4 %    Platelets 341 150 - 400 K/uL   nRBC 0.0 0.0 - 0.2 %   Neutrophils Relative % 70 %   Neutro Abs 5.4 1.7 - 7.7 K/uL   Lymphocytes Relative 22 %   Lymphs Abs 1.7 0.7 - 4.0 K/uL   Monocytes Relative 7 %   Monocytes Absolute 0.5 0.1 - 1.0 K/uL   Eosinophils Relative 0 %   Eosinophils Absolute 0.0 0.0 - 0.5 K/uL   Basophils Relative 1 %   Basophils Absolute 0.0 0.0 - 0.1 K/uL   Immature Granulocytes 0 %   Abs Immature Granulocytes 0.03 0.00 - 0.07 K/uL    Comment: Performed at [facility], [address], [place], [place] [address]  Comprehensive metabolic panel     Status: Abnormal   Collection Time: 05/27/22  2:30 PM  Result Value Ref Range   Sodium 136 135 - 145 mmol/L   Potassium 4.0 3.5 - 5.1 mmol/L   Chloride 108 98 - 111 mmol/L   CO2 24 22 - 32 mmol/L   Glucose, Bld 95 70 - 99 mg/dL    Comment: Glucose reference range applies only to samples taken after fasting for at least 8 hours.   BUN 8 6 - 20 mg/dL   Creatinine, Ser 4.25 0.44 - 1.00 mg/dL   Calcium 9.4 8.9 - 95.6 mg/dL   Total Protein 7.5 6.5 - 8.1 g/dL   Albumin 4.1 3.5 - 5.0 g/dL   AST 15 15 - 41 U/L   ALT 14 0 - 44 U/L   Alkaline Phosphatase 45 38 - 126 U/L   Total Bilirubin 0.3 0.3 - 1.2 mg/dL   GFR, Estimated >38 >75 mL/min    Comment: (NOTE) Calculated using the CKD-EPI Creatinine Equation (2021)    Anion gap 4 (L) 5 - 15    Comment: Performed at [facility], [address] [place], [place] [address]  ABO/Rh     Status: None   Collection Time: 05/27/22  2:30 PM  Result Value  Ref Range   ABO/RH(D) O POS    No rh immune globuloin      NOT A RH IMMUNE GLOBULIN CANDIDATE, PT RH POSITIVE Performed at [facility], [address] [place], [place] [address]   hCG, quantitative, pregnancy     Status: Abnormal   Collection Time: 05/27/22  2:30 PM  Result Value Ref Range   hCG, Beta Chain, Quant, S 10,176 (H) <5 mIU/mL    Comment:          GEST. AGE      CONC.  (mIU/mL)   <=1 WEEK         5 - 50     2 WEEKS       50 - 500     3 WEEKS       100 - 10,000     4 WEEKS     1,000 - 30,000     5 WEEKS     3,500 - 115,000   6-8 WEEKS     12,000 - 270,000    12 WEEKS     15,000 - 220,000        FEMALE AND NON-PREGNANT FEMALE:     LESS THAN 5 mIU/mL Performed at [facility], [address] [place], [place] [address]   HIV Antibody (routine testing w rflx)     Status: None   Collection Time: 05/27/22  2:30 PM  Result Value Ref Range   HIV Screen 4th Generation wRfx Non Reactive Non Reactive    Comment: Performed at [facility], [address] [place], [place] [address]  Wet prep, genital     Status: Abnormal   Collection Time: 05/27/22  2:32 PM   Specimen: PATH Cytology Cervicovaginal Ancillary Only  Result Value Ref Range   Yeast Wet Prep HPF POC NONE SEEN NONE SEEN   Trich, Wet Prep NONE SEEN NONE SEEN   Clue Cells Wet Prep HPF POC PRESENT (A) NONE SEEN   WBC, Wet Prep HPF POC <10 <10   Sperm NONE SEEN     Comment: Performed at [facility], [address] [place], [place] [address]    Review of Systems  Constitutional:  Negative for fever.  Gastrointestinal:  Positive for abdominal pain.  Genitourinary:  Positive for vaginal bleeding.   Physical Exam   Blood pressure 122/60, pulse 73, temperature 98.3 F (36.8 C), temperature source Oral, resp. rate 17, height 4\' 11"  (1.499 m), weight 74.7 kg, last menstrual period 04/15/2022, SpO2 99 %.  Physical Exam Constitutional:      General: She is not in acute distress.    Appearance: She is well-developed. She is not ill-appearing, toxic-appearing or diaphoretic.  HENT:     Head: Normocephalic.  Eyes:     Pupils: Pupils are equal, round, and reactive to light.  Abdominal:     Tenderness: There is no abdominal tenderness.  Genitourinary:    Comments: Patient self swabbed in the bathroom in MAU  Skin:    General: Skin is warm.  Neurological:     Mental Status: She is alert and oriented to  person, place, and time.     MAU Course  Procedures  MDM  Wet prep & GC HIV, CBC, Hcg, ABO US OB transvaginal   O positive blood type   Assessment and Plan   A:  1. Vaginal bleeding in pregnancy, first trimester   2. [redacted] weeks gestation of pregnancy   3.  Bacterial vaginosis      P:  Dc home  Return to MAU if symptoms worsen She has an appointment with the pregnancy care center on December 15th- keep that appointment and [place]. Rx: Flagyl Pelvic rest  Warda Mcqueary, Harolyn Rutherford, NP 05/27/2022 7:19 PM

## 2022-05-30 ENCOUNTER — Inpatient Hospital Stay (HOSPITAL_COMMUNITY)
Admission: AD | Admit: 2022-05-30 | Discharge: 2022-05-30 | Disposition: A | Discharge status: Home / Self Care | Payer: Medicaid - Out of State | Attending: Obstetrics & Gynecology | Admitting: Obstetrics & Gynecology

## 2022-05-30 DIAGNOSIS — O99511 Diseases of the respiratory system complicating pregnancy, first trimester: Secondary | ICD-10-CM | POA: Insufficient documentation

## 2022-05-30 DIAGNOSIS — Z202 Contact with and (suspected) exposure to infections with a predominantly sexual mode of transmission: Secondary | ICD-10-CM

## 2022-05-30 DIAGNOSIS — O99321 Drug use complicating pregnancy, first trimester: Secondary | ICD-10-CM | POA: Insufficient documentation

## 2022-05-30 DIAGNOSIS — O99331 Smoking (tobacco) complicating pregnancy, first trimester: Secondary | ICD-10-CM | POA: Insufficient documentation

## 2022-05-30 DIAGNOSIS — Z3A01 Less than 8 weeks gestation of pregnancy: Secondary | ICD-10-CM | POA: Insufficient documentation

## 2022-05-30 LAB — GC/CHLAMYDIA PROBE AMP (~~LOC~~) NOT AT ARMC
Chlamydia: NEGATIVE
Comment: NEGATIVE
Comment: NORMAL
Neisseria Gonorrhea: NEGATIVE

## 2022-05-30 MED ORDER — LIDOCAINE HCL (PF) 1 % IJ SOLN
2.0000 mL | Freq: Once | INTRAMUSCULAR | Status: AC
  Administered 2022-05-30: 2 mL
  Filled 2022-05-30: qty 5

## 2022-05-30 MED ORDER — AZITHROMYCIN 250 MG PO TABS
1000.0000 mg | ORAL_TABLET | Freq: Once | ORAL | Status: AC
  Administered 2022-05-30: 1000 mg via ORAL
  Filled 2022-05-30: qty 4

## 2022-05-30 MED ORDER — CEFTRIAXONE SODIUM 500 MG IJ SOLR
500.0000 mg | Freq: Once | INTRAMUSCULAR | Status: AC
  Administered 2022-05-30: 500 mg via INTRAMUSCULAR
  Filled 2022-05-30: qty 500

## 2022-05-30 MED ORDER — CEFTRIAXONE SODIUM 500 MG IJ SOLR: 250.0000 mg | Freq: Once | INTRAMUSCULAR | Status: DC

## 2022-05-30 NOTE — MAU Provider Note (Signed)
History     CSN: [id number]  Arrival date and time: 05/30/22 4967   Event Date/Time   First Provider Initiated Contact with Patient 05/30/22 [phone]      Chief Complaint  Patient presents with   ? STI exposure   Pamela Brown is a 21 y.o. G2P0010 at [redacted]w[redacted]d who presents today for STD testing. She was here the other day and had testing, but results are not available yet. However, she reports that when she went home the other day she found out that her partner had tested + and had not told her about this. No changes in her symptoms from the other day. She had a complete work up with IUP noted on [place].     OB History     Gravida  2   Para      Term      Preterm      AB  1   Living  0      SAB      IAB  1   Ectopic      Multiple      Live Births              Past Medical History:  Diagnosis Date   Anemia    Asthma    Headache    UTI (urinary tract infection)     Past Surgical History:  Procedure Laterality Date   THERAPEUTIC ABORTION      Family History  Problem Relation Age of Onset   Heart disease Mother    Other Father        unknown    Social History   Tobacco Use   Smoking status: Former    Types: Cigars   Smokeless tobacco: Never   Tobacco comments:    Quit with +pareg  Vaping Use   Vaping Use: Former  Substance Use Topics   Alcohol use: Not Currently   Drug use: Not Currently    Types: Marijuana    Comment: last 3rd wk inNov    Allergies: No Known Allergies  Medications Prior to Admission  Medication Sig Dispense Refill Last Dose   acetaminophen (TYLENOL) 500 MG tablet Take 2 tablets (1,000 mg total) by mouth every 6 (six) hours as needed. (Patient taking differently: Take 1,000 mg by mouth every 6 (six) hours as needed.) 30 tablet 0    albuterol (PROVENTIL) (5 MG/ML) 0.5% nebulizer solution Take 2.5 mg by nebulization every 6 (six) hours as needed for wheezing or shortness of breath.      metroNIDAZOLE (FLAGYL) 500 MG tablet  Take 1 tablet (500 mg total) by mouth 2 (two) times daily for 7 days. 14 tablet 0     Review of Systems  All other systems reviewed and are negative.  Physical Exam   Blood pressure 130/69, pulse 84, temperature 98.4 F (36.9 C), temperature source Oral, resp. rate 17, height 4\' 11"  (1.499 m), weight 75.4 kg, last menstrual period 04/15/2022, SpO2 99 %.  Physical Exam Constitutional:      Appearance: She is well-developed.  HENT:     Head: Normocephalic.  Eyes:     Pupils: Pupils are equal, round, and reactive to light.  Cardiovascular:     Rate and Rhythm: Normal rate and regular rhythm.     Heart sounds: Normal heart sounds.  Pulmonary:     Effort: Pulmonary effort is normal. No respiratory distress.     Breath sounds: Normal breath sounds.  Abdominal:  Palpations: Abdomen is soft.     Tenderness: There is no abdominal tenderness.  Genitourinary:    Vagina: No bleeding. Vaginal discharge: mucusy.    Comments: External: no lesion Vagina: small amount of white discharge     Musculoskeletal:        General: Normal range of motion.     Cervical back: Normal range of motion and neck supple.  Skin:    General: Skin is warm and dry.  Neurological:     Mental Status: She is alert and oriented to person, place, and time.  Psychiatric:        Mood and Affect: Mood normal.        Behavior: Behavior normal.     MAU Course  Procedures  MDM DW patient that results are not back yet, and offered treatment today or to wait for results. She would like treatment today.  500mg  Rocephin IM (per updated CDC guidelines) 1g Azithromycin PO   Assessment and Plan   1. STD exposure   2. [redacted] weeks gestation of pregnancy    DC home Comfort measures reviewed  1st Trimester precautions  Bleeding precautions RX: none  Return to MAU as needed FU with OB as planned   Follow-up Information     Department, [facility] Follow up.   Contact information: [address] [place] [phone]                [phone] DNP, CNM  05/30/22  9:27 AM

## 2022-05-30 NOTE — MAU Note (Signed)
Pamela Brown is a 21 y.o. at [redacted]w[redacted]d here in MAU reporting: just found out her boyfriend 'had a chlamydia card', he says he hasn't been messing around.  She has been tested multiple times, has never had an STI.  (Explained to pt she was tested the other day, the results are not back yet, should be back today) Stiill having mild cramping, no bleeding.  Onset of complaint: last night Pain score: still having mild cramping.  Vitals:   05/30/22 0847  BP: 130/69  Pulse: 84  Resp: 17  Temp: 98.4 F (36.9 C)  SpO2: 99%      Lab orders placed from triage:  none

## 2022-06-27 NOTE — L&D Delivery Note (Signed)
OB/GYN Faculty Practice Delivery Note  Pamela Brown is a 22 y.o. G2P1011 s/p SVD at [redacted]w[redacted]d. She was admitted for PROM.   ROM: 18h 46m with clear fluid GBS Status:  Negative/-- (07/11 0000) Maximum Maternal Temperature:  Temp (24hrs), Avg:97.9 F (36.6 C), Min:97.6 F (36.4 C), Max:98.5 F (36.9 C)    Labor Progress: Patient arrived at 3 cm dilation and was induced with pitocin .   Delivery Date/Time: 01/06/2023 at 1741 Delivery: Called to room and patient was complete and pushing. Head delivered in ROA position. No nuchal cord present. Shoulder and body delivered in usual fashion. Infant with spontaneous cry, placed on mother's abdomen, dried and stimulated. Cord clamped x 2 after 1-minute delay, and cut by FOB. Cord blood drawn. Placenta delivered spontaneously with gentle cord traction. Fundus firm with massage and Pitocin. Labia, perineum, vagina, and cervix inspected with 2nd degree perineal laceration repaired .   Placenta:  spontaneous, intact, 3 vessel cord  Complications: None Lacerations: 2nd degree perineal repaired with 3-0 vicryl suture  EBL: 487 mL Analgesia: Epidural    Infant: APGAR (1 MIN): 9  APGAR (5 MINS): 9  APGAR (10 MINS):    Weight: 3190 g  Derrel Nip, MD  OB Fellow  01/06/2023 7:10 PM

## 2022-07-13 LAB — OB RESULTS CONSOLE ABO/RH: RH Type: POSITIVE

## 2022-07-13 LAB — OB RESULTS CONSOLE ANTIBODY SCREEN: Antibody Screen: NEGATIVE

## 2022-07-13 LAB — OB RESULTS CONSOLE HEPATITIS B SURFACE ANTIGEN: Hepatitis B Surface Ag: NEGATIVE

## 2022-07-13 LAB — OB RESULTS CONSOLE RUBELLA ANTIBODY, IGM: Rubella: IMMUNE

## 2022-07-13 LAB — HEPATITIS C ANTIBODY: HCV Ab: NEGATIVE

## 2022-07-13 LAB — OB RESULTS CONSOLE RPR: RPR: NONREACTIVE

## 2022-07-13 LAB — OB RESULTS CONSOLE VARICELLA ZOSTER ANTIBODY, IGG: Varicella: IMMUNE

## 2022-07-13 LAB — SICKLE CELL SCREEN: Sickle Cell Screen: NEGATIVE

## 2022-07-23 DIAGNOSIS — J45909 Unspecified asthma, uncomplicated: Secondary | ICD-10-CM | POA: Insufficient documentation

## 2022-08-26 ENCOUNTER — Other Ambulatory Visit: Payer: Self-pay

## 2022-08-26 ENCOUNTER — Inpatient Hospital Stay (HOSPITAL_COMMUNITY)
Admission: AD | Admit: 2022-08-26 | Discharge: 2022-08-26 | Disposition: A | Discharge status: Home / Self Care | Payer: Medicaid Other | Attending: Obstetrics & Gynecology | Admitting: Obstetrics & Gynecology

## 2022-08-26 DIAGNOSIS — Z711 Person with feared health complaint in whom no diagnosis is made: Secondary | ICD-10-CM

## 2022-08-26 DIAGNOSIS — Z3A19 19 weeks gestation of pregnancy: Secondary | ICD-10-CM | POA: Insufficient documentation

## 2022-08-26 DIAGNOSIS — Z3482 Encounter for supervision of other normal pregnancy, second trimester: Secondary | ICD-10-CM | POA: Diagnosis not present

## 2022-08-26 NOTE — MAU Provider Note (Signed)
Event Date/Time   First Provider Initiated Contact with Patient 08/26/22 1023      S Ms. Pamela Brown is a 22 y.o. G2P0010 patient who presents to MAU today requesting an ultrasound. She has moved here from [place] and missed her appointment for her anatomy there. She is requesting it be done today.   Denies any pain, bleeding or vaginal discharge.   O BP 119/65 (BP Location: Right Arm)   Pulse 80   Temp 98.3 F (36.8 C) (Oral)   Resp 18   Ht '4\' 11"'$  (1.499 m)   Wt 73.7 kg   LMP 04/15/2022   SpO2 98%   BMI 32.80 kg/m  Physical Exam Vitals and nursing note reviewed.  Constitutional:      General: She is not in acute distress.    Appearance: She is well-developed.  HENT:     Head: Normocephalic.  Eyes:     Pupils: Pupils are equal, round, and reactive to light.  Cardiovascular:     Rate and Rhythm: Normal rate and regular rhythm.     Heart sounds: Normal heart sounds.  Pulmonary:     Effort: Pulmonary effort is normal. No respiratory distress.     Breath sounds: Normal breath sounds.  Abdominal:     General: Bowel sounds are normal. There is no distension.     Palpations: Abdomen is soft.     Tenderness: There is no abdominal tenderness.  Skin:    General: Skin is warm and dry.  Neurological:     Mental Status: She is alert and oriented to person, place, and time.  Psychiatric:        Mood and Affect: Mood normal.        Behavior: Behavior normal.        Thought Content: Thought content normal.        Judgment: Judgment normal.     FHT: 148 bpm  A Medical screening exam complete 1. Physically well but worried   2. [redacted] weeks gestation of pregnancy    -Discussed that routine ultrasound cannot be done in MAU. Offered to order outpatient ultrasound and patient agreeable.  P -Discharge home in stable condition -Second trimester precautions discussed -Patient advised to follow-up with OB as scheduled for prenatal care -Patient may return to MAU as needed or if  her condition were to change or worsen   Wende Mott, [place] 08/26/2022 10:23 AM

## 2022-08-26 NOTE — Discharge Instructions (Signed)

## 2022-08-26 NOTE — MAU Note (Signed)
Pamela Brown is a 22 y.o. at 23w0dhere in MAU reporting: [place] and [place] secondary [facility]  Reports last PN visit was approx 5 weeks ago.  Denies VB, reports occasional cramps.  States the cramping has been ongoing and informed it's normal LMP: NA Onset of complaint: NA Pain score: 4 Vitals:   08/26/22 1018  BP: 119/65  Pulse: 80  Resp: 18  Temp: 98.3 F (36.8 C)  SpO2: 98%     FHT:148 bpm Lab orders placed from triage:   None

## 2022-08-29 ENCOUNTER — Encounter: Payer: Self-pay | Admitting: *Deleted

## 2022-09-06 ENCOUNTER — Ambulatory Visit (INDEPENDENT_AMBULATORY_CARE_PROVIDER_SITE_OTHER): Payer: Medicaid Other | Admitting: Family Medicine

## 2022-09-06 ENCOUNTER — Encounter: Payer: Self-pay | Admitting: Family Medicine

## 2022-09-06 VITALS — BP 121/68 | HR 82 | Wt 167.9 lb

## 2022-09-06 DIAGNOSIS — Z3A20 20 weeks gestation of pregnancy: Secondary | ICD-10-CM

## 2022-09-06 DIAGNOSIS — Z3482 Encounter for supervision of other normal pregnancy, second trimester: Secondary | ICD-10-CM | POA: Diagnosis not present

## 2022-09-06 DIAGNOSIS — Z3480 Encounter for supervision of other normal pregnancy, unspecified trimester: Secondary | ICD-10-CM | POA: Insufficient documentation

## 2022-09-06 MED ORDER — BLOOD PRESSURE MONITORING DEVI: 1.0000 | 0 refills | Status: AC

## 2022-09-06 MED ORDER — ASPIRIN 81 MG PO TBEC: 81.0000 mg | DELAYED_RELEASE_TABLET | Freq: Every day | ORAL | 12 refills | Status: DC

## 2022-09-06 NOTE — Progress Notes (Deleted)
Subjective:  Pamela Brown is a 22 y.o. G2P0010 at 49w4dbeing seen today for prenatal care.  Patient reports {sx:14538}.   .   .  . Denies leaking of fluid.   The following portions of the patient's history were reviewed and updated as appropriate: allergies, current medications, past family history, past medical history, past social history, past surgical history and problem list.   Objective:  There were no vitals filed for this visit.  Fetal Status:           General:  Alert, oriented and cooperative. Patient is in no acute distress.  Skin: Skin is warm and dry. No rash noted.   Cardiovascular: Normal heart rate noted  Respiratory: Normal respiratory effort, no problems with respiration noted  Abdomen: Soft, gravid, appropriate for gestational age.       Vaginal:  .       Cervix: Not evaluated        Extremities: Normal range of motion.     Mental Status: Normal mood and affect. Normal behavior. Normal judgment and thought content.   Urinalysis:      Assessment and Plan:  Pregnancy: G2P0010 at 260w4d1. Supervision of other normal pregnancy, antepartum ***  {Blank single:19197::"Preterm","Term"} labor symptoms and general obstetric precautions including but not limited to vaginal bleeding, contractions, leaking of fluid and fetal movement were reviewed in detail with the patient. Please refer to After Visit Summary for other counseling recommendations.  No follow-ups on file.   [place]

## 2022-09-06 NOTE — Progress Notes (Signed)
Subjective:   Pamela Brown is a 22 y.o. G2P0010 at 18w4dby LMP being seen today for her first obstetrical visit.  Her obstetrical history is significant for obesity. Patient does intend to breast feed. Pregnancy history fully reviewed.  Patient reports no complaints.  HISTORY: OB History  Gravida Para Term Preterm AB Living  2 0 0 0 1 0  SAB IAB Ectopic Multiple Live Births  0 1 0 0 0    # Outcome Date GA Lbr Len/2nd Weight Sex Delivery Anes PTL Lv  2 Current           1 IAB            Last pap smear was unknown. Awaiting records. Past Medical History:  Diagnosis Date   Anemia    Asthma    Headache    UTI (urinary tract infection)    Past Surgical History:  Procedure Laterality Date   THERAPEUTIC ABORTION     Family History  Problem Relation Age of Onset   Heart disease Mother    Other Father        unknown   Social History   Tobacco Use   Smoking status: Former    Types: Cigars   Smokeless tobacco: Never   Tobacco comments:    Quit with +pareg  Vaping Use   Vaping Use: Former  Substance Use Topics   Alcohol use: Not Currently   Drug use: Not Currently    Types: Marijuana    Comment: last 3rd wk inNov   No Known Allergies Current Outpatient Medications on File Prior to Visit  Medication Sig Dispense Refill   albuterol (PROVENTIL) (5 MG/ML) 0.5% nebulizer solution Take 2.5 mg by nebulization every 6 (six) hours as needed for wheezing or shortness of breath.     Prenatal MV & Min w/FA-DHA (PRENATAL GUMMIES PO) Take by mouth.     acetaminophen (TYLENOL) 500 MG tablet Take 2 tablets (1,000 mg total) by mouth every 6 (six) hours as needed. (Patient taking differently: Take 1,000 mg by mouth every 6 (six) hours as needed.) 30 tablet 0   No current facility-administered medications on file prior to visit.     Exam   Vitals:   09/06/22 1452  BP: 121/68  Pulse: 82  Weight: 167 lb 14.4 oz (76.2 kg)   Fetal Heart Rate (bpm): 157               Uterus: No examined    Pelvic Exam: Not preformed   General: well-developed, well-nourished female in no acute distress  Breast:  normal appearance, no masses or tenderness  Skin: normal coloration and turgor, no rashes  Neurologic: oriented, normal, negative, normal mood  Extremities: normal strength, tone, and muscle mass, ROM of all joints is normal  HEENT PERRLA, extraocular movement intact and sclera clear, anicteric  Mouth/Teeth mucous membranes moist, pharynx normal without lesions and dental hygiene good  Neck supple and no masses  Cardiovascular: regular rate and rhythm  Respiratory:  no respiratory distress, normal breath sounds  Abdomen: soft, non-tender; bowel sounds normal; no masses,  no organomegaly     Assessment:   Pregnancy: G2P0010 Patient Active Problem List   Diagnosis Date Noted   Supervision of other normal pregnancy, antepartum 09/06/2022     Plan:  1. Supervision of other normal pregnancy, antepartum -Continue prenatal vitamins  -Discussed water birth, will schedule follow up with CNW to discus further. Discussed that she can not be full cleared  for water birth until day of delivery. Pt expresses understanding.  -Follow up anatomy scan 4/17.  -Follow up in 4 weeks for routine prenatal care   - Blood Pressure Monitoring DEVI; 1 each by Does not apply route once a week.  Dispense: 1 each; Refill: 0  Indications for ASA therapy (per uptodate) One of the following: Previous pregnancy with preeclampsia, especially early onset and with an adverse outcome No Multifetal gestation No Chronic hypertension No Type 1 or 2 diabetes mellitus No Chronic kidney disease No Autoimmune disease (antiphospholipid syndrome, systemic lupus erythematosus) No  Two or more of the following: Nulliparity Yes Obesity (body mass index >30 kg/m2) Yes Family history of preeclampsia in mother or sister No Age ?35 years No Sociodemographic characteristics (African American race,  low socioeconomic level) Yes Personal risk factors (eg, previous pregnancy with low birth weight or small for gestational age infant, previous adverse pregnancy outcome [eg, stillbirth], interval >10 years between pregnancies) No  Initial labs drawn. Continue prenatal vitamins. Genetic Screening discussed, First trimester screen, Quad screen, and NIPS:  Awaiting records  . Ultrasound discussed; fetal anatomic survey: results reviewed. Problem list reviewed and updated. The nature of [facility] with multiple MDs and other Advanced Practice Providers was explained to patient; also emphasized that residents, students are part of our team. Routine obstetric precautions reviewed. No follow-ups on file.  Resident Attestation  I saw and evaluated the patient, performing the key elements of the service.I  personally performed or re-performed the history, physical exam, and medical decision making activities of this service and have verified that the service and findings are accurately documented in the student's note. I developed the management plan that is described in the medical student's note, and I agree with the content, with my edits above.    Gifford Shave, MD OB Fellow

## 2022-09-06 NOTE — Progress Notes (Signed)
Pt reports that she fell on Sunday & went to ED in [place] everything was fine.

## 2022-09-06 NOTE — Patient Instructions (Signed)
[place] [facility] Providers  [place] ([address]) [facility] Brown, MD; Chambliss, MD; Eniola, MD; Hensel, MD; McDiarmid, MD; McIntyer, MD [address] [place], [place] [address] [phone] Mon-Fri 8:30-12:30, 1:30-5:00  Providers come to see babies during newborn hospitalization Only accepting infants of Mother's who are seen at Family Medicine Center or have siblings seen at   [facility] Center Medicaid - Yes; Tricare - Yes   [facility] Mulberry, MD [address] [place], [place] [address] [phone] Mon, Tue, Thur, Fri 8:30-5:00, Wed 10:00-7:00 (closed 1-2pm daily for lunch) Takes [place] residents with no insurance.  [place] only with Medicaid/insurance; Tricare - no  [facility] Ben-Davies, MD; Brown, MD; Chandler, MD; Ettefagh, MD; Grant, MD; Hanvey, MD; Herrin, MD; Jones,  MD; Lester, MD; McCormick, MD; McQueen, MD; Simha, MD; Stanley, MD; Stryffeler, NP [address], [place], [place] [address] [phone] Mon, Tue, Thur, Fri 8:30-5:30, Wed 9:30-5:30, Sat 8:30-12:30 Only accepting infants of first-time parents or siblings of current patients Hospital discharge coordinator will make follow-up appointment Medicaid - yes; Tricare - yes  [place] ([address]) [facility] Cox, MD; Davis, MD; Dovico, MD; Ettefaugh, MD; Lowe, MD; Nation, MD; Slimp, MD; Sumner, MD; Williams, MD [address], [place], [place] [address] [phone] Mon-Fri 8:30-5:00, closed for lunch 12:30-1:30; Sat-Sun 10:00-1:00 Accepting Newborns with commercial insurance only, must call prior to delivery to be accepted into  practice.  Medicaid - no, Tricare - yes   [facility] [address] [place], [place] [address] [phone] or [phone] Mon to Fri 8am to 10pm, Sat 8am to 1pm  (virtual only on weekends) Only accepts Medicaid Healthy Blue pts  [facility]  Artis, MD; Coccaro, MD; [place] Gardner, MD; Netherton, NP; Roper, MD; Wilmot, PA-C; Skinner, MD [address] [place], [place] [address] [phone] Mon-Fri 8:30-5:30 Medicaid - yes, Tricare - yes  [place] ([address]) [facility] Warner, MD [address], [place], [place] [address] [phone] Mon, Tues, Wed Fri 8:30-5:00, Sat 8:30-12:00, Closed Thursdays Accepting siblings of established patients and first time mom's if you call prenatally Medicaid- yes; Tricare - yes  [facility] Becker, PA; Hagler, MD; Quinn, PA-C; Scifres, PA; Sun, MD; Swayne, MD;  [address], [place], [place] [address] [phone] Mon-Fri 8:30-5:00, closed for lunch 1-2 Only accepting newborns of established patients Medicaid- no; Tricare - yes  [place] ([address]) [facility] Timberlake, MD; [address], [place], [place] [address] [phone] Mon-Fri 8:00-5:00 Medicaid - No; Tricare - Yes  [facility] [employer]  Brake, NP; Wharton, PA [address], [place], [place] [address] [phone] Mon-Fri 8:00-5:00 Medicaid - No, Tricare - Yes  [facility] Gay, MD; Quinlan, MD; Blatt, DNP [address] [place], [place] [address] [phone]  Mon-Fri 8:00-5:00 Medicaid - No; Tricare - Yes  [facility] [address] [place], [place] [address] [phone] Mon-Fri 8:30-5:00 (lunch 12:00-1:00) Medicaid -Yes; Tricare - Yes  [facility] Jordan, MD [address], [place], [place] [address] [phone] Mon-Fri 8:00-5:00 Seeing newborns of current patients only. No new patients Medicaid - No, Tricare - yes  [facility] Parker, MD [address] [place], [place] [address] [phone] Mon-Fri 8:00-5:00 Medicaid -yes as secondary coverage only;   Tricare - yes  [facility] Brecken, PA; Christy, NP; Dees, MD; DeClaire, MD; DeWeese, MD; Hodge, PA; Smoot, NP; Summer, MD; Vapne, MD [address] [place], [place] [address] [phone] Mon-Fri 8:30-5:00, Sat 9:00-11:00 Accepts commercial insurance ONLY. Offers free prenatal information sessions for families. Medicaid - No, Tricare - Call first  [facility] Bouska, MD; Gordon, PA; Jeffery, PA; Weber, PA [address] [place] [address] [phone] Mon-Fri 7:30-5:30 Medicaid - Yes; Tricare - yes  [place] ([address] & [address])  [facility] Reese, MD [address] [place], [place] [address] [phone] Mon-Thur 8:00-6:00, closed for lunch 12-2, closed Fridays Medicaid - yes; Tricare - no  [facility] Anderson, NP; Badger, MD; Beal, PA; Spencer, PA [address], [place], [place] [address] [phone] Mon-Fri 7:30-4:30 Medicaid - yes, Tricare - yes  [facility]  Agbuya, MD; Klett, NP; Romgoolam, MD; Rothstein, NP [address], [place], [place] [address] [phone] Mon-Fri 8:30-5:00, closed for lunch 1-2, Sat 8:30-12:00 - sick visits only Providers come to see babies at [facility] Only accepting newborns of siblings and first time parents ONLY if who have met with office prior to delivery Medicaid -Yes; Tricare - yes  [facility] [place]  Golden, DO; Friddle, NP; Wallace, MD; Wood, MD:  [address], [place], [place] [address] [phone] Mon- Fri 8:00-5:00, Sat 9:00-12:00 - sick visits only Accepting siblings of established patients and first time mom/baby Medicaid - Yes; Tricare - yes Patients must have vaccinations (baby vaccines)  [place] ([address]  [address])  [facility] [address] [place], [place] [address] [phone] Mon-Fri 8:00-5:00 Medicaid - no; Tricare - yes  [facility] Briscoe, MD; Schmidt, PA; Moreira, PA [address], [place], [place] [address] [phone] Mon-Fri 8:00-5:00 Medicaid- yes; Tricare - yes  [facility] Boyd, MD; Jones, NP; Osborn, PA [address], [place], [place] [address] [phone] Mon-Fri 8:00-5:00 Medicaid - Yes; Tricare - yes  [place] ([address])  [facility] Atkinson, PA; Calderon, PA; Cummings, MD; Dillard, MD; Henrish, NP; Isenhour, DO; Martin, PA; Olson, MD; Ott, MD; Phillips, MD; Valente, PA; VanDeven, PA; Yonjof, NP [address], [place], [place] [address] [phone] Mon-Fri 8:30-5:00, Sat 9:00-12:00 - sick only Please register online [email or web address] then schedule online or call office Medicaid-Yes; Tricare -yes  [facility]  Dabrusco, MD; Dial, MD; Quincy, MD; Fleenor, NP; Goolsby, PA; Tonuzi, MD; Turner, NP; West, MD [address], [place], [place] [address] [phone] Mon-Fri 8:00-5:30, Sat&Sun by appointment (phones open at 8:30) Medicaid - Yes; Tricare - yes  [place] ([address]) [facility] Allen, CPNP; Bates, MD; Gordon, MD; Mills, NP; Weinshilboum, DO [address], [place],  [address] [phone] M-F 8:00 - 5:15, Sat/Sun 9-12 sick visits only Medicaid - No; Tricare - yes  [facility] [place] [facility]  Brown, PA-C; Cowen, PA-C; Dennis, DO; Fuster, PA-C; Martin, PA-C; Shelton, PA-C; Spry, MD [address] [place],  [address] [phone] Mon-Thur 8:00-7:00, Fri 8:00-5:00 Accepting Medicaid for 13 and under only   [facility] [place] [facility] [place] Hayes, FNP; List, FNP; Moran, MD; Pitonzo, PA-C; Scholer,   MD; Spangle, FNP; Nzenwa, FNP; Jasper, MD; Moran, MD [address] [place], [place] [address] [phone] Mon-Fri 8:30-5:30 Medicaid - Yes; Tricare - yes  [facility] [address] [facility]  Kelly, CPNP; Logan, MD; Poth, MD; Ramadoss, MD; Staton, NP [address], [place], [place] [address] [phone] Mon-Thur 8:00-5:30, Fri 8:00-5:00, Sat 9:00-12:00 Medicaid - yes, Tricare - yes  [place] ([address])  [facility] [place] Masneri, DO; Meyers, MD; Nelson, PA [address], [place], [place] [address] [phone] Mon-Fri 8:00-5:00, closed for lunch 12-1 Medicaid - No; Tricare - yes  [facility] [place] McGowen, MD [address], [place], [place] [address] [phone] Mon-Fri 8:00-5:00 Medicaid - No; Tricare - yes  [facility] [place] MacDonald, MD; Nayak, MD; Kearns, MD; Jones, MD [address] [place] [address], [place], [place] [address] [phone] Mon-Fri 8:00-5:00 Medicaid- Yes; Tricare - yes  [place] ([address])  [facility] [place] Martin, PA-C; Tabori, MD [address], [place], [place] [address] [phone] Mon-Fri 8:00-5:00 Medicaid - No; Tricare - yes  [facility] [place]  Margin - CPNP [address], [place], [place] [address] [phone] Mon-Weds 8:00-6:00, Thurs-Fri 8:00-5:00, Sat 9:00-12:00 Medicaid - yes; Tricare - yes   [facility] [place] Aubuchon, MD; Brandon, PA [address] [place], [place] [address] [phone] Mon-Fri 8:00-5:00 Medicaid - yes; Tricare - yes  [place] [facility] Providers  [facility] [address], [place], [place] [address] [phone] M, Thur: 8am -8pm, Tues, Weds: 8am - 5pm; Fri: 8-1 Medicaid - Yes; Tricare -  yes  [facility] Mertz, MD; Johnson, MD; Wells, MD; Downs, PA; Hockenberger, PA [address], [place], [place] [address] [phone] M-F 8:30 - 5:00 Medicaid - Call office; Tricare -yes  [facility] Bonney, MD; Page, MD, Minter, MD; Mueller, PNP; Thomason, NP [address], [place], [place] [address] [phone] M-F 8:30 - 5:00, Sat/Sun 8:30 - 12:30 (sick visits) Medicaid - Call office; Tricare -yes  [facility] Lewis, MD; Shaub, PNP; Boylston, MD; Quaile, PA; Nonato, NP; Landon, CPNP [address], [place], [place] [address] [phone] M-F 8:30 - 5:00 Medicaid - Call office; Tricare - yes  [facility] [place] Cline, MD; Dvergsten, MD; Flores, MD; Kawatu, MD; Nogo, MD [address], [place], [place] [address] [phone] M-Thur: 8:00 - 5:00; Fri: 8:00 - 4:00 Medicaid - yes; Tricare - yes  [facility] [address] [place], , [place] [address] [phone] M-F: 8:30- 5:00, closed for lunch 12:30 - 1:00 Medicaid - yes; Tricare -yes  [facility] [place] [address], [place], [place] [address] [phone] M-F 8:00 - 5:00 Medicaid - yes; Tricare - yes  [facility] Johnson, DO; Rumball, DO; Wicker, NP [address] [place], [place], [place] [address] [phone] M-F 8:00 - 5:00, Closed 12-1 for lunch Medicaid - Call; Tricare - yes  [facility] Stein, MD [address], , [place] [address] [phone] M-F: 8:00-5:00, Sat: 8:00 - noon Medicaid - call; Tricare -yes  [place] [facility] Providers  [facility] Collins, FNP-C [address], [place],  [address] [phone] M-W: 8:00-5:00, Thur: 8:00 - 7:00, Fri: 8:00 - noon Medicaid - yes; Tricare - yes  [facility] [place] Adams, FNP [address], [place],   [place] [address] [phone] M-F 8:00 - 5:00, Closed for lunch 12-1 Medicaid -  yes; Tricare - yes  [place] [facility] Providers  [facility] Smith, FNP, Melvin, MD, Fay, FNP-C [address] [place] [address] [place], [place], [place] [address] [phone] M-T 8:00-5:00, Wed-Fri 7:00-6:00 Medicaid - Yes; Tricare -yes  [facility] [place] Civiletti, DO; [address], [place], [place] [address] [phone] M-F 8:00 - 5:00, closed for lunch 12-1 Medicaid - Yes; Tricare - yes  [facility]  Barnes, MD; Bergdolt, MD; [place]ulfield, MD; Emrich, MD; Fiscus, MD; Hoppens, MD; Kylstra, MD, McPherson, MD; Todd, MD; Prestwood, MD; Waters, MD; Wood, MD [address], [place], [place] [address] [phone] M-F 8:00-5:00 Medicaid - yes; Tricare - yes  [facility] Cheema, MD (speaks Punjabi and Hindi) [address] [place], [place] [address] [phone] M-F: 8:30 - 5:00, closed 12:30 - 1 for lunch Medicaid - Yes; Tricare -yes  [place] [facility] Providers  [facility] Loda, MD; Timberlake, MD; Burke, MD [address], [place], [place] [address] [phone] M-Th: 8:00 - 5:30, Fri: 8:00 - 12:00 Medicaid - yes; Tricare - yes  [facility] [place] Lookabill, NP; Meier, MD; Daffron, MD [address], [place], [place] [address] [phone] M-F: 8:00 - 5:00 Medicaid - yes; Tricare - yes  [facility] Busse, NP; Bowman, NP; Baune, NP; Entwistle, MD; Williams, MD, Huffman, NP, Ferguson, MD; Patel, DO [address], [place], [place] [address] [phone] M-F: 8:30 - 5:30p Medicaid - yes; Tricare - yes Other locations available as well  [facility] [facility] Rajan, MD; Wilson, MD; Morgan, PA-C, Domenech, PA-C; Myers, PA-C [address], [place], [place] [address] [phone] M-W: 8:00am - 7:00pm, Thurs: 8:00am - 8:00pm; Fri: 8:00am -  5:00pm, closed daily from 12-1 for lunch Medicaid - yes; Tricare - yes  [place] [facility] Providers  [facility] [place] Adams, MD; Crystal, FNP; Hadley, MD; Stokes, MD; Johnson, PNP; Brady, PA-C; West, PNP; Gardner, MD;  [address] [place] [address], [place], [place] [address] [phone] M - Fri: 8am - 5pm, Sat 9-noon Medicaid - Yes; Tricare -yes  [facility] [place] Nayak, MD; Jones, FNP; McDonald, MD; Kearns, MD [address] [place] [address], [place], [place] [phone] M-F 8:00 - 5:00 Medicaid - call; Tricare - yes  [facility] [place] Bell, MD; Emory, PNP; Pinder, MD; Anderson, MD; Light, PA-C; Johnson, MD; Latta, MD; Saul, PNP; Rainey, MD; Clifford, MD; McClung, MD [address], [place], [place] [address] [phone] M-F 8:00am - 5:00pm; Sat. 9:00 - 11:00 Medicaid - yes; Tricare - yes  [facility] [place] Soldato-Couture, MD [address], [place], [place] [address] [phone] M-F 8:00 - 5:00 Medicaid - [place] Medicaid only; Tricare - yes  [facility] [place] Walker, MD; Davis, PNP; Ajizian, MD [address] [place] [address], [place], [place] [address] [phone] M-F 8:00 - 5:00 Medicaid - yes; Tricare - yes  [facility] [place] Barry, MD; Brown, MD, Forest, MD, Hazek, MD; Hoyle, MD; Smith, MD; [address], [place], [place] [address] [phone] M-F: 8-5 Medicaid - yes; Tricare - yes  [facility] [place] Brady, Md; Dowlen, MD; [address] [place] [address], [place], [place] [address] [phone] M-F 8-5 Medicaid - yes; Tricare - yes  [facility] Kearns, MD; Nayak, MD;   Soldato-Courture, MD; Pellam-Palmer, DNP; Herring, PNP [address], [place], [place] [address] [phone] M-F 8-5 Medicaid - yes; Tricare - yes  [facility] Weathers, MD;  Merritt, PA-C; Davis-PA-C; Warnimont, MD [address], [place], [place] [address] [phone] M-F 7am - 5 pm Medicaid - call; Tricare - yes  [facility] Hill, PNP; Erickson, MD; Robinson, MD [address], [place], [place] [address] [phone] M-F 8-5 Medicaid - yes; Tricare - yes  [facility] Kribbs, MD; Warner, MD; [place], FNP; Brooks, FNP; Boles, FNP; Romblad, PA-C; [place] - FNP [address], [place], [place] [address] [phone] M-F 8-5 Medicaid- yes; Tricare - yes  [facility] [place] [place] Yoder, MD; Verenes, MD; Armentrout, MD; Stewart, MD; Beasley, CPNP; Ford, MD; Erickson, MD; Rice, MD [address], [place], [place] [address] [phone] M-F: 8-5, Sat: 9-4, Sun 9-12 Medicaid - yes; Tricare - yes  [facility] Little, PNP; Davis, PNP [address], [place], [place] [address] [phone] M-F 8 - 5, closed 12-1 for lunch Medicaid - yes; Tricare - yes  [facility] Friesen, MD; Cnegia, MD; Rice, MD; Patel, DO [address], [place], [place] [address] [phone] M-F 8- 5:30 Medicaid - yes; Tricare - yes  [facility] [place] Zvolensky, MD; Ray, MD; Haas, MD [address] [address], [place], [place] [address] [phone] M: 8-7; Tues-Fri: 8-5; Sat: 9-12 Medicaid - yes; Tricare - yes  [facility] [place] Heinrich, MD; Meyer, MD; Clark, MD; Rhyne, MD; Aubuchon, MD [address], [place], [place] [address] [phone] M: 8-7; Tues-Fri: 8-5; Sat: 8:30-12:30 Medicaid - yes; Tricare - yes  [facility] [place] Bista, MD; Dillard, PA [address], [place], [place] [address] [phone] Mon-Fri: 8-5 Medicaid - yes;  Tricare - yes  [facility] [place] Beasley, CPNP; Mahle, CPNP; Rice, MD; Duffy, MD; Culler, MD; [address], [place], [place] [address] [phone] M-F: 8-5, closed 1-2 for lunch Medicaid - yes; Tricare - yes  [facility] Rickman, PA; Mounce, NP; Smith, MD; Jordan, CPNP; Darty, PA; Ball, MD; Wallace, MD [address] [place] [address], [place], [place] [address] [phone] M-Thurs: 8-7; Fri: 8-6; Sat: 9-12; Sun 2-4 Medicaid - yes; Tricare - yes  [facility] Brown, MD; Shin, MD; Goodman, DNP, FNP; Sebesta, DO; [address], [place], [place] [address] [phone] M-F: 8-5 Medicaid - yes; Tricare - yes  [place] [facility] Providers  [facility] Dough, MD; Welsh, NP [address], [place], [place] [address] [phone] M - Fri: 8am - 5pm, closed for lunch 12-1 Medicaid - Yes; Tricare - yes   [facility] Manandhar, MD; Riley, MD; Sanger, DO; Vinocur, MD;Hall, PA; Walsh, PA; Campbell, NP [address] [place] [address], [place] [address] [phone] M-F 8:00 - 5:00, Sat 8:00 - 11:30 Medicaid - yes; Tricare - yes  [facility] Khan, MD; Redding, MD, Street, MD, Holt, MD, Burgart, MD; Rhyne, NP; Dickinson, PA;  [address] [place] [address], [place],  [address] [phone] M-F 8:10am - 5:00pm Medicaid - yes; Tricare - yes    [facility] Connors, MD; Kime, NP [address], [place], [place] [address] [phone] M-F 8:00 - 5:00 Medicaid - [place] Medicaid only; Tricare - yes  [facility] [place] Whyte, MD; Fox, NP [address], [place], [place] [address] [phone] M-F 8:00 - 5:00; Closed for lunch 12 - 1:00 Medicaid - yes;  Tricare - yes  [facility] Penner, MD; Wilburn, FNP [address], [place], [place] [address] [phone] Mon 9-5; Tues/Wed 10-5; Thurs 8:30-5; Fri: 8-12:30 Medicaid - yes; Tricare - yes  [place] [facility] Providers  [facility]  Golding, MD; Jackson, PA-C [address], [place], [place] [address] [phone] phone [phone] fax M-F 7:15 - 4:30 Medicaid - yes; Tricare - yes  [facility] Gosrani, MD; Meccariello, DO [address] [place], [place] [address] [phone] M-Fri: 8:30 - 5:00, closed for lunch everyday noon - 1pm Medicaid - Yes; Tricare - yes  [facility] Burdine, MD; Daniel, MD; Howard, MD; Sasser, MD; Boles, PA; Boyd, PA-C; Carroll, PA; McGee, PA; Skillman, PA; Wilson, PA [address] [place], [place] [address] [phone] M-Thurs: 7:30am - 7:00pm; Friday 7:30am - 4pm; Sat: 8:00 - 1:00 Medicaid - Yes; Tricare - yes  [facility] [place] Akhbari, MD; Law, MD; Qayumi, MD; Salvador, DO [address], [place], [place] [address] [phone] M-Thur: 8:00 - 5:00, Fri: 8:00 - Noon Medicaid - yes; Tricare - yes No [place] [employer]  [facility] Dettinger, MD; Gottschalk, DO; Hawks, NP; Martin, NP; Morgan, NP; Milian, NP; Rakes, NP; Stacks, MD; Webster, PA [address], [place], [place] [address] [phone] M-F 8:00 - 5:00 Medicaid - yes; Tricare - yes  [facility] Collins, FNP-C; Bucio, FNP-C [address], [place], [place] [address] [phone] M, W, R 8:00-5:00, Tues: 8:00am - 7:00pm; Fri 8:00 - noon Medicaid - Yes; Tricare - yes  [facility] Khan, MD [address] [place] [address] [place], [place] [address] [phone]  M-Thurs 8:30-5:30, Fri: 8:30-12:30pm Medicaid - Yes; Tricare - N  

## 2022-09-08 ENCOUNTER — Encounter: Payer: Self-pay | Admitting: General Practice

## 2022-09-13 ENCOUNTER — Other Ambulatory Visit: Payer: Self-pay

## 2022-09-13 ENCOUNTER — Inpatient Hospital Stay (HOSPITAL_COMMUNITY)
Admission: AD | Admit: 2022-09-13 | Discharge: 2022-09-13 | Disposition: A | Discharge status: Home / Self Care | Payer: Medicaid Other | Attending: Family Medicine | Admitting: Family Medicine

## 2022-09-13 ENCOUNTER — Encounter (HOSPITAL_COMMUNITY): Payer: Self-pay | Admitting: Family Medicine

## 2022-09-13 DIAGNOSIS — N898 Other specified noninflammatory disorders of vagina: Secondary | ICD-10-CM | POA: Diagnosis present

## 2022-09-13 DIAGNOSIS — Z3A21 21 weeks gestation of pregnancy: Secondary | ICD-10-CM

## 2022-09-13 DIAGNOSIS — O23592 Infection of other part of genital tract in pregnancy, second trimester: Secondary | ICD-10-CM | POA: Insufficient documentation

## 2022-09-13 DIAGNOSIS — B379 Candidiasis, unspecified: Secondary | ICD-10-CM

## 2022-09-13 DIAGNOSIS — Z3480 Encounter for supervision of other normal pregnancy, unspecified trimester: Secondary | ICD-10-CM

## 2022-09-13 DIAGNOSIS — O26892 Other specified pregnancy related conditions, second trimester: Secondary | ICD-10-CM

## 2022-09-13 DIAGNOSIS — R102 Pelvic and perineal pain: Secondary | ICD-10-CM

## 2022-09-13 DIAGNOSIS — N760 Acute vaginitis: Secondary | ICD-10-CM | POA: Diagnosis not present

## 2022-09-13 DIAGNOSIS — O98812 Other maternal infectious and parasitic diseases complicating pregnancy, second trimester: Secondary | ICD-10-CM | POA: Diagnosis not present

## 2022-09-13 DIAGNOSIS — B9689 Other specified bacterial agents as the cause of diseases classified elsewhere: Secondary | ICD-10-CM | POA: Diagnosis not present

## 2022-09-13 LAB — URINALYSIS, ROUTINE W REFLEX MICROSCOPIC
Bilirubin Urine: NEGATIVE
Glucose, UA: NEGATIVE mg/dL
Hgb urine dipstick: NEGATIVE
Ketones, ur: 80 mg/dL — AB
Nitrite: NEGATIVE
Protein, ur: NEGATIVE mg/dL
Specific Gravity, Urine: 1.017 (ref 1.005–1.030)
pH: 6 (ref 5.0–8.0)

## 2022-09-13 LAB — WET PREP, GENITAL
Sperm: NONE SEEN
Trich, Wet Prep: NONE SEEN
WBC, Wet Prep HPF POC: >=10 — AB (ref <10)

## 2022-09-13 MED ORDER — TERCONAZOLE 0.4 % VA CREA: 1.0000 | TOPICAL_CREAM | Freq: Every day | VAGINAL | 0 refills | Status: DC

## 2022-09-13 MED ORDER — NYSTATIN-TRIAMCINOLONE 100000-0.1 UNIT/GM-% EX CREA: TOPICAL_CREAM | CUTANEOUS | 0 refills | Status: DC

## 2022-09-13 MED ORDER — METRONIDAZOLE 500 MG PO TABS: 500.0000 mg | ORAL_TABLET | Freq: Two times a day (BID) | ORAL | 0 refills | Status: DC

## 2022-09-13 NOTE — MAU Note (Signed)
Pamela Brown is a 22 y.o. at [redacted]w[redacted]d here in MAU reporting: she has symptoms of a yeast infection.  Reports has white clumpy discharge, vaginal itching, and pinkish spotting.  Reports vagina is irritated, unable to wear panties.  Also c/o sharp mid-lower abdominal pain that's constant.  Reports +FM. LMP: NA Onset of complaint: 3 days ago. Pain score: 8 Vitals:   09/13/22 1126  BP: 129/67  Pulse: 71  Resp: 18  Temp: 98.2 F (36.8 C)  SpO2: 100%     FHT:152 bpm Lab orders placed from triage:   None

## 2022-09-13 NOTE — MAU Provider Note (Signed)
History     CSN: [id number]  Arrival date and time: 09/13/22 1102   Event Date/Time   First Provider Initiated Contact with Patient 09/13/22 1206      No chief complaint on file.  Pamela Brown , a  22 y.o. G2P0010 at [redacted]w[redacted]d presents to MAU with complaints of white clumpy discharge and vaginal itching and burning for the last 3 days. Patient states she has had a yeast infection before and this feels similar, but worse. She denies attempting to relieve symptoms. She denies sexual intercourse. She also report lower pelvic cramping and pain. She states that also started 3 days ago. She states over the last 2 days she also noticed some pink tinged running discharge as well. She denies painful urination but noted when urine hits her vaginal it burns. She denies back pain and endorses positive fetal movement.          OB History     Gravida  2   Para  0   Term  0   Preterm  0   AB  1   Living  0      SAB  0   IAB  1   Ectopic  0   Multiple  0   Live Births  0           Past Medical History:  Diagnosis Date   Anemia    Asthma    Headache    UTI (urinary tract infection)     Past Surgical History:  Procedure Laterality Date   THERAPEUTIC ABORTION      Family History  Problem Relation Age of Onset   Heart disease Mother    Other Father        unknown    Social History   Tobacco Use   Smoking status: Former    Types: Cigars   Smokeless tobacco: Never   Tobacco comments:    Quit with +pareg  Vaping Use   Vaping Use: Former  Substance Use Topics   Alcohol use: Not Currently   Drug use: Not Currently    Types: Marijuana    Comment: last 3rd wk inNov    Allergies: No Known Allergies  Medications Prior to Admission  Medication Sig Dispense Refill Last Dose   Prenatal MV & Min w/FA-DHA (PRENATAL GUMMIES PO) Take by mouth.   09/12/2022   acetaminophen (TYLENOL) 500 MG tablet Take 2 tablets (1,000 mg total) by mouth every 6 (six) hours as  needed. (Patient taking differently: Take 1,000 mg by mouth every 6 (six) hours as needed.) 30 tablet 0    albuterol (PROVENTIL) (5 MG/ML) 0.5% nebulizer solution Take 2.5 mg by nebulization every 6 (six) hours as needed for wheezing or shortness of breath.      aspirin EC 81 MG tablet Take 1 tablet (81 mg total) by mouth daily. Swallow whole. 30 tablet 12    Blood Pressure Monitoring DEVI 1 each by Does not apply route once a week. 1 each 0     Review of Systems  Constitutional:  Negative for chills, fatigue and fever.  Eyes:  Negative for pain and visual disturbance.  Respiratory:  Negative for apnea, shortness of breath and wheezing.   Cardiovascular:  Negative for chest pain and palpitations.  Gastrointestinal:  Positive for abdominal pain. Negative for constipation, diarrhea, nausea and vomiting.  Genitourinary:  Positive for pelvic pain, vaginal discharge and vaginal pain. Negative for difficulty urinating, dysuria and vaginal bleeding.  Musculoskeletal:  Negative  for back pain.  Neurological:  Negative for seizures, weakness and headaches.  Psychiatric/Behavioral:  Negative for suicidal ideas.    Physical Exam   Blood pressure (!) 111/50, pulse 66, temperature 98.2 F (36.8 C), temperature source Oral, resp. rate 18, height 4\' 11"  (1.499 m), weight 75.2 kg, last menstrual period 04/15/2022, SpO2 96 %.  Physical Exam Vitals and nursing note reviewed. Exam conducted with a chaperone present.  Constitutional:      General: She is not in acute distress.    Appearance: Normal appearance.  HENT:     Head: Normocephalic.  Cardiovascular:     Rate and Rhythm: Normal rate.  Pulmonary:     Effort: Pulmonary effort is normal.  Abdominal:     Palpations: Abdomen is soft.     Tenderness: There is no abdominal tenderness. There is no guarding.     Comments: Pregnant   Genitourinary:    General: Normal vulva.     Vagina: Vaginal discharge present.     Comments: Copious amounts of  white clumpy discharge noted to inner folds of labia and around clitoris. Thin white fluid noted on exam with large amounts of white discharge adhered to vaginal walls.  Musculoskeletal:     Cervical back: Normal range of motion.  Skin:    General: Skin is warm and dry.     Capillary Refill: Capillary refill takes 2 to 3 seconds.  Neurological:     Mental Status: She is alert and oriented to person, place, and time.  Psychiatric:        Mood and Affect: Mood normal.    FHT collected in triage   MAU Course  Procedures Orders Placed This Encounter  Procedures   Wet prep, genital   Urinalysis, Routine w reflex microscopic -Urine, Clean Catch   Fern Test   Results for orders placed or performed during the hospital encounter of 09/13/22 (from the past 24 hour(s))  Wet prep, genital     Status: Abnormal   Collection Time: 09/13/22 11:43 AM  Result Value Ref Range   Yeast Wet Prep HPF POC PRESENT (A) NONE SEEN   Trich, Wet Prep NONE SEEN NONE SEEN   Clue Cells Wet Prep HPF POC PRESENT (A) NONE SEEN   WBC, Wet Prep HPF POC >=10 (A) <10   Sperm NONE SEEN     MDM - Wet prep positive for yeast and BV, consistent with vaginal discharge noted on exam.  -UA reflexed to culture - Fern negative  - Plan for discharge   Assessment and Plan   1. Yeast infection   2. Supervision of other normal pregnancy, antepartum   3. Bacterial vaginosis   4. Pelvic pain affecting pregnancy in second trimester, antepartum   5. [redacted] weeks gestation of pregnancy    - Reviewed that vaginal infections like yeast and BV are likely that cause of abdominal cramping and pelvic pain.  - Preventative measures for decreasing reoccurrence of yeast and BV discussed at patient bedside.  - Rx for Terazol and Metronidazole, Nystatin sent to outpatient pharmacy.  - Also reviewed dehydration and UTI can cause pelvic pain and cramping. Patient to be notified of Culture results upon completion.  - Worsening signs and  return precautions reviewed  - Patient discharged home in stable condition and may return to MAU as needed.   Jacquiline Doe, MSN CNM  09/13/2022, 12:58 PM

## 2022-09-14 LAB — GC/CHLAMYDIA PROBE AMP (~~LOC~~) NOT AT ARMC
Chlamydia: NEGATIVE
Comment: NEGATIVE
Comment: NORMAL
Neisseria Gonorrhea: NEGATIVE

## 2022-10-02 ENCOUNTER — Encounter (HOSPITAL_COMMUNITY): Payer: Self-pay | Admitting: Obstetrics and Gynecology

## 2022-10-02 ENCOUNTER — Inpatient Hospital Stay (HOSPITAL_COMMUNITY)
Admission: AD | Admit: 2022-10-02 | Discharge: 2022-10-02 | Disposition: A | Discharge status: Home / Self Care | Payer: Medicaid Other | Attending: Obstetrics and Gynecology | Admitting: Obstetrics and Gynecology

## 2022-10-02 ENCOUNTER — Inpatient Hospital Stay (HOSPITAL_COMMUNITY): Payer: Medicaid Other

## 2022-10-02 DIAGNOSIS — R1030 Lower abdominal pain, unspecified: Secondary | ICD-10-CM | POA: Insufficient documentation

## 2022-10-02 DIAGNOSIS — O26892 Other specified pregnancy related conditions, second trimester: Secondary | ICD-10-CM | POA: Diagnosis not present

## 2022-10-02 DIAGNOSIS — J45909 Unspecified asthma, uncomplicated: Secondary | ICD-10-CM | POA: Insufficient documentation

## 2022-10-02 DIAGNOSIS — M549 Dorsalgia, unspecified: Secondary | ICD-10-CM

## 2022-10-02 DIAGNOSIS — R079 Chest pain, unspecified: Secondary | ICD-10-CM | POA: Insufficient documentation

## 2022-10-02 DIAGNOSIS — R0602 Shortness of breath: Secondary | ICD-10-CM | POA: Insufficient documentation

## 2022-10-02 DIAGNOSIS — Z3A24 24 weeks gestation of pregnancy: Secondary | ICD-10-CM | POA: Insufficient documentation

## 2022-10-02 DIAGNOSIS — O99512 Diseases of the respiratory system complicating pregnancy, second trimester: Secondary | ICD-10-CM | POA: Insufficient documentation

## 2022-10-02 DIAGNOSIS — O99891 Other specified diseases and conditions complicating pregnancy: Secondary | ICD-10-CM

## 2022-10-02 LAB — COMPREHENSIVE METABOLIC PANEL
ALT: 12 U/L (ref 0–44)
AST: 18 U/L (ref 15–41)
Albumin: 3.1 g/dL — ABNORMAL LOW (ref 3.5–5.0)
Alkaline Phosphatase: 59 U/L (ref 38–126)
Anion gap: 8 (ref 5–15)
BUN: <5 mg/dL — ABNORMAL LOW (ref 6–20)
CO2: 20 mmol/L — ABNORMAL LOW (ref 22–32)
Calcium: 8.9 mg/dL (ref 8.9–10.3)
Chloride: 105 mmol/L (ref 98–111)
Creatinine, Ser: 0.57 mg/dL (ref 0.44–1.00)
GFR, Estimated: >60 mL/min (ref >60)
Glucose, Bld: 93 mg/dL (ref 70–99)
Potassium: 3.7 mmol/L (ref 3.5–5.1)
Sodium: 133 mmol/L — ABNORMAL LOW (ref 135–145)
Total Bilirubin: 0.4 mg/dL (ref 0.3–1.2)
Total Protein: 6.8 g/dL (ref 6.5–8.1)

## 2022-10-02 LAB — URINALYSIS, ROUTINE W REFLEX MICROSCOPIC
Bilirubin Urine: NEGATIVE
Glucose, UA: NEGATIVE mg/dL
Hgb urine dipstick: NEGATIVE
Ketones, ur: NEGATIVE mg/dL
Leukocytes,Ua: NEGATIVE
Nitrite: NEGATIVE
Protein, ur: NEGATIVE mg/dL
Specific Gravity, Urine: 1.019 (ref 1.005–1.030)
pH: 7 (ref 5.0–8.0)

## 2022-10-02 LAB — TROPONIN I (HIGH SENSITIVITY): Troponin I (High Sensitivity): <2 ng/L (ref <18)

## 2022-10-02 LAB — CBC
HCT: 31.5 % — ABNORMAL LOW (ref 36.0–46.0)
Hemoglobin: 10.4 g/dL — ABNORMAL LOW (ref 12.0–15.0)
MCH: 26.5 pg (ref 26.0–34.0)
MCHC: 33 g/dL (ref 30.0–36.0)
MCV: 80.4 fL (ref 80.0–100.0)
Platelets: 258 10*3/uL (ref 150–400)
RBC: 3.92 MIL/uL (ref 3.87–5.11)
RDW: 14.7 % (ref 11.5–15.5)
WBC: 9.1 10*3/uL (ref 4.0–10.5)
nRBC: 0 % (ref 0.0–0.2)

## 2022-10-02 LAB — LIPASE, BLOOD: Lipase: 26 U/L (ref 11–51)

## 2022-10-02 LAB — D-DIMER, QUANTITATIVE: D-Dimer, Quant: 1.24 ug/mL-FEU — ABNORMAL HIGH (ref 0.00–0.50)

## 2022-10-02 MED ORDER — CYCLOBENZAPRINE HCL 10 MG PO TABS: 10.0000 mg | ORAL_TABLET | Freq: Three times a day (TID) | ORAL | 2 refills | Status: DC | PRN

## 2022-10-02 MED ORDER — METOCLOPRAMIDE HCL 10 MG PO TABS: 10.0000 mg | ORAL_TABLET | Freq: Three times a day (TID) | ORAL | 1 refills | Status: DC | PRN

## 2022-10-02 MED ORDER — CYCLOBENZAPRINE HCL 5 MG PO TABS
10.0000 mg | ORAL_TABLET | Freq: Once | ORAL | Status: AC
  Administered 2022-10-02: 10 mg via ORAL
  Filled 2022-10-02: qty 2

## 2022-10-02 MED ORDER — LACTATED RINGERS IV SOLN: INTRAVENOUS | Status: DC

## 2022-10-02 MED ORDER — HYDROMORPHONE HCL 1 MG/ML IJ SOLN
1.0000 mg | Freq: Once | INTRAMUSCULAR | Status: AC
  Administered 2022-10-02: 1 mg via INTRAVENOUS
  Filled 2022-10-02: qty 1

## 2022-10-02 NOTE — MAU Note (Signed)
.  [name] is a 22 y.o. at [redacted]w[redacted]d here in MAU reporting: Pt reporting "severe lower lower back pain that shoots upward. Upper and lower abdominal cramping. Denies vaginal bleeding or discharge. Denies SROM. Endorses + fetal movement Pt rocking due to pain per report- Mildly tachypenic at 22 O2 sats WNL Mucous membranes pink Denies chest pain or pressure No acute signs of distress  Onset of complaint: 0300-abdominal and back pain 1600-awoken from sleep with SOB Pain score: 9 back                    7 abdomen  Vitals:   10/02/22 1957 10/02/22 2000  BP:  131/70  Pulse:  93  Resp:  (!) 22  SpO2: 100% 100%     FHT:158 Lab orders placed from triage:  UA

## 2022-10-02 NOTE — MAU Provider Note (Signed)
History     [id number]  Arrival date and time: 10/02/22 1936    Chief Complaint  Patient presents with   Shortness of Breath   Emesis     HPI Pamela Brown is a 22 y.o. at [redacted]w[redacted]d by LMP who presents for abdominal pain & shortness of breath. Reports constant lower abdominal pain since 3 am. Pain radiates to low back & mid epigastric area. Nothing makes better or worse. Hasn't treated pain. Vomited once today due to pain. Last BM was yesterday. Denies fever, nausea, diarrhea, dysuria, vaginal bleeding, or LOF. Reports good fetal movement.  Shortness of breath since 3 pm. Has history of asthma but states this feels different. Feels like she's struggling to take breaths. Since arrival to MAU has developed mid chest pain. Denies cough, palpitations, calf pain, recent travel, VTE history. Hasn't required treatment for asthma since elementary school.   O/Positive/-- (01/17 0000)  OB History     Gravida  2   Para  0   Term  0   Preterm  0   AB  1   Living  0      SAB  0   IAB  1   Ectopic  0   Multiple  0   Live Births  0           Past Medical History:  Diagnosis Date   Anemia    Asthma    Headache    UTI (urinary tract infection)     Past Surgical History:  Procedure Laterality Date   THERAPEUTIC ABORTION      Family History  Problem Relation Age of Onset   Heart disease Mother    Other Father        unknown    Social History   Socioeconomic History   Marital status: Single    Spouse name: Not on file   Number of children: Not on file   Years of education: Not on file   Highest education level: Not on file  Occupational History   Not on file  Tobacco Use   Smoking status: Former    Types: Cigars   Smokeless tobacco: Never   Tobacco comments:    Quit with +pareg  Vaping Use   Vaping Use: Former  Substance and Sexual Activity   Alcohol use: Not Currently   Drug use: Not Currently    Types: Marijuana    Comment: last 3rd wk inNov    Sexual activity: Yes    Birth control/protection: None  Other Topics Concern   Not on file  Social History Narrative   Not on file   Social Determinants of Health   Financial Resource Strain: Not on file  Food Insecurity: Not on file  Transportation Needs: Not on file  Physical Activity: Not on file  Stress: Not on file  Social Connections: Not on file  Intimate Partner Violence: Not on file    No Known Allergies  No current facility-administered medications on file prior to encounter.   Current Outpatient Medications on File Prior to Encounter  Medication Sig Dispense Refill   Prenatal MV & Min w/FA-DHA (PRENATAL GUMMIES PO) Take by mouth.     acetaminophen (TYLENOL) 500 MG tablet Take 2 tablets (1,000 mg total) by mouth every 6 (six) hours as needed. (Patient taking differently: Take 1,000 mg by mouth every 6 (six) hours as needed.) 30 tablet 0   albuterol (PROVENTIL) (5 MG/ML) 0.5% nebulizer solution Take 2.5 mg by nebulization every 6 (  six) hours as needed for wheezing or shortness of breath.     aspirin EC 81 MG tablet Take 1 tablet (81 mg total) by mouth daily. Swallow whole. 30 tablet 12   Blood Pressure Monitoring DEVI 1 each by Does not apply route once a week. 1 each 0     ROS Pertinent positives and negative per HPI, all others reviewed and negative  Physical Exam   BP 131/70 (BP Location: Right Arm)   Pulse 93   Resp (!) 22   Ht 4' 11.02" (1.499 m)   Wt 75.6 kg   LMP 04/15/2022   SpO2 100%   BMI 33.63 kg/m   Patient Vitals for the past 24 hrs:  BP Temp src Pulse Resp SpO2 Height Weight  10/02/22 2000 131/70 Oral 93 (!) 22 100 % 4' 11.02" (1.499 m) 75.6 kg  10/02/22 1957 -- -- -- -- 100 % -- --    Physical Exam Vitals and nursing note reviewed.  Constitutional:      General: She is not in acute distress.    Appearance: She is well-developed. She is not ill-appearing.  HENT:     Head: Normocephalic and atraumatic.  Eyes:     General: No scleral  icterus.       Right eye: No discharge.        Left eye: No discharge.     Conjunctiva/sclera: Conjunctivae normal.  Cardiovascular:     Rate and Rhythm: Regular rhythm. Tachycardia present.     Heart sounds: Normal heart sounds.  Pulmonary:     Effort: Pulmonary effort is normal. Tachypnea present. No accessory muscle usage or respiratory distress.     Breath sounds: No decreased breath sounds, wheezing or rhonchi.  Abdominal:     Palpations: Abdomen is soft.     Tenderness: There is abdominal tenderness in the epigastric area. There is right CVA tenderness and left CVA tenderness. There is no guarding or rebound. Negative signs include Murphy's sign.     Comments: Gravid. Ctx not palpated.   Musculoskeletal:     Right lower leg: No edema.     Left lower leg: No edema.     Comments: Negative homan  Neurological:     General: No focal deficit present.     Mental Status: She is alert.  Psychiatric:        Mood and Affect: Mood normal.        Behavior: Behavior normal.      Cervical Exam    Bedside Ultrasound ***  My interpretation: ***  FHT Baseline ***, *** variability, ***accels, ***decels Toco: *** Cat: ***  Labs No results found for this or any previous visit (from the past 24 hour(s)).  Imaging No results found.  MAU Course  Procedures  Lab Orders         Urinalysis, Routine w reflex microscopic -Urine, Clean Catch    No orders of the defined types were placed in this encounter.  Imaging Orders  No imaging studies ordered today    MDM {Desc; straightforward/low/moderate/high:13498}  Assessment and Plan  No diagnosis found.  #FWB: ***    Dispo: discharged to home in stable condition.     Judeth Horn, NP 10/02/22 8:27 PM  Allergies as of 10/02/2022   No Known Allergies   Med Rec must be completed prior to using this [facility]***

## 2022-10-02 NOTE — MAU Note (Signed)
Patient returned from radiology

## 2022-10-02 NOTE — MAU Note (Signed)
Patient transport at bedside to take patient to radiology.  °

## 2022-10-04 ENCOUNTER — Encounter: Payer: Self-pay | Admitting: Advanced Practice Midwife

## 2022-10-04 ENCOUNTER — Ambulatory Visit (INDEPENDENT_AMBULATORY_CARE_PROVIDER_SITE_OTHER): Payer: Medicaid Other | Admitting: Advanced Practice Midwife

## 2022-10-04 VITALS — BP 102/58 | HR 76 | Wt 163.5 lb

## 2022-10-04 DIAGNOSIS — Z3A24 24 weeks gestation of pregnancy: Secondary | ICD-10-CM | POA: Diagnosis not present

## 2022-10-04 DIAGNOSIS — Z3482 Encounter for supervision of other normal pregnancy, second trimester: Secondary | ICD-10-CM | POA: Diagnosis not present

## 2022-10-04 DIAGNOSIS — Z3480 Encounter for supervision of other normal pregnancy, unspecified trimester: Secondary | ICD-10-CM

## 2022-10-04 MED ORDER — FAMOTIDINE 40 MG PO TABS: 40.0000 mg | ORAL_TABLET | Freq: Every day | ORAL | 11 refills | Status: DC

## 2022-10-04 MED ORDER — VITAFOL GUMMIES 3.33-0.333-34.8 MG PO CHEW
3.0000 | CHEWABLE_TABLET | Freq: Every day | ORAL | 11 refills | Status: DC
Start: 2022-10-04 — End: 2023-01-03

## 2022-10-04 NOTE — Progress Notes (Signed)
   PRENATAL VISIT NOTE  Subjective:  Pamela Brown is a 22 y.o. G2P0010 at [redacted]w[redacted]d being seen today for ongoing prenatal care.  She is currently monitored for the following issues for this low-risk pregnancy and has Supervision of other normal pregnancy, antepartum and Asthma on their problem list.  Patient reports  abdominal pain and shortness of breath continue since MAU visit .  Contractions: Irritability. Vag. Bleeding: None.  Movement: Present. Denies leaking of fluid.   The following portions of the patient's history were reviewed and updated as appropriate: allergies, current medications, past family history, past medical history, past social history, past surgical history and problem list.   Objective:   Vitals:   10/04/22 1019  BP: (!) 102/58  Pulse: 76  Weight: 163 lb 8 oz (74.2 kg)    Fetal Status: Fetal Heart Rate (bpm): 152 Fundal Height: 24 cm Movement: Present     General:  Alert, oriented and cooperative. Patient is in no acute distress.  Skin: Skin is warm and dry. No rash noted.   Cardiovascular: Normal heart rate noted  Respiratory: Normal respiratory effort, no problems with respiration noted  Abdomen: Soft, gravid, appropriate for gestational age.  Pain/Pressure: Present     Pelvic: Cervical exam deferred        Extremities: Normal range of motion.  Edema: None  Mental Status: Normal mood and affect. Normal behavior. Normal judgment and thought content.   Assessment and Plan:  Pregnancy: G2P0010 at [redacted]w[redacted]d 1. Supervision of other normal pregnancy, antepartum - routine care - Prenatal Vit-Fe Phos-FA-Omega (VITAFOL GUMMIES) 3.33-0.333-34.8 MG CHEW; Chew 3 tablets by mouth daily.  Dispense: 90 tablet; Refill: 11  2. [redacted] weeks gestation of pregnancy - discussed normal physiology that causes shortness of breath in pregnancy - Abdominal support belt given today and RN showed patient how to wear it - RX pepcid for heartburn   Preterm labor symptoms and general  obstetric precautions including but not limited to vaginal bleeding, contractions, leaking of fluid and fetal movement were reviewed in detail with the patient. Please refer to After Visit Summary for other counseling recommendations.   Return in about 4 weeks (around 11/01/2022).  Future Appointments  Date Time Provider Department Center  10/06/2022  1:30 PM [facility] [place] [facility]  10/06/2022  1:45 PM [facility] US5 [facility]  11/01/2022  8:15 AM Kathlene Cote [facility]  11/01/2022  9:30 AM [facility] LAB [facility]    Thressa Sheller DNP, CNM  10/04/22  10:30 AM

## 2022-10-04 NOTE — Patient Instructions (Signed)

## 2022-10-05 ENCOUNTER — Encounter: Payer: Self-pay | Admitting: *Deleted

## 2022-10-06 ENCOUNTER — Ambulatory Visit: Payer: Medicaid Other | Admitting: *Deleted

## 2022-10-06 ENCOUNTER — Ambulatory Visit: Payer: Medicaid Other

## 2022-10-06 ENCOUNTER — Encounter: Payer: Self-pay | Admitting: *Deleted

## 2022-10-06 VITALS — BP 116/68 | HR 74

## 2022-10-06 DIAGNOSIS — O321XX0 Maternal care for breech presentation, not applicable or unspecified: Secondary | ICD-10-CM | POA: Diagnosis not present

## 2022-10-06 DIAGNOSIS — Z3A24 24 weeks gestation of pregnancy: Secondary | ICD-10-CM | POA: Diagnosis not present

## 2022-10-06 DIAGNOSIS — Z711 Person with feared health complaint in whom no diagnosis is made: Secondary | ICD-10-CM | POA: Insufficient documentation

## 2022-10-06 DIAGNOSIS — Z3480 Encounter for supervision of other normal pregnancy, unspecified trimester: Secondary | ICD-10-CM | POA: Diagnosis present

## 2022-10-06 DIAGNOSIS — O99012 Anemia complicating pregnancy, second trimester: Secondary | ICD-10-CM | POA: Insufficient documentation

## 2022-10-06 DIAGNOSIS — Z363 Encounter for antenatal screening for malformations: Secondary | ICD-10-CM | POA: Insufficient documentation

## 2022-10-06 DIAGNOSIS — O99212 Obesity complicating pregnancy, second trimester: Secondary | ICD-10-CM | POA: Insufficient documentation

## 2022-10-06 DIAGNOSIS — O99512 Diseases of the respiratory system complicating pregnancy, second trimester: Secondary | ICD-10-CM | POA: Insufficient documentation

## 2022-10-06 DIAGNOSIS — O0932 Supervision of pregnancy with insufficient antenatal care, second trimester: Secondary | ICD-10-CM | POA: Insufficient documentation

## 2022-10-06 DIAGNOSIS — Z3A19 19 weeks gestation of pregnancy: Secondary | ICD-10-CM | POA: Insufficient documentation

## 2022-10-10 ENCOUNTER — Other Ambulatory Visit: Payer: Self-pay | Admitting: *Deleted

## 2022-10-10 DIAGNOSIS — O0932 Supervision of pregnancy with insufficient antenatal care, second trimester: Secondary | ICD-10-CM

## 2022-10-10 DIAGNOSIS — R638 Other symptoms and signs concerning food and fluid intake: Secondary | ICD-10-CM

## 2022-10-10 DIAGNOSIS — O99212 Obesity complicating pregnancy, second trimester: Secondary | ICD-10-CM

## 2022-10-10 DIAGNOSIS — Z362 Encounter for other antenatal screening follow-up: Secondary | ICD-10-CM

## 2022-10-12 ENCOUNTER — Other Ambulatory Visit: Payer: Medicaid Other

## 2022-10-12 ENCOUNTER — Ambulatory Visit: Payer: Medicaid Other

## 2022-11-01 ENCOUNTER — Encounter: Payer: Medicaid Other | Admitting: Medical

## 2022-11-01 ENCOUNTER — Other Ambulatory Visit: Payer: Medicaid Other

## 2022-11-07 ENCOUNTER — Other Ambulatory Visit: Payer: Self-pay | Admitting: Lactation Services

## 2022-11-07 DIAGNOSIS — Z3480 Encounter for supervision of other normal pregnancy, unspecified trimester: Secondary | ICD-10-CM

## 2022-11-08 ENCOUNTER — Other Ambulatory Visit: Payer: Medicaid Other

## 2022-11-08 ENCOUNTER — Encounter: Payer: Medicaid Other | Admitting: Obstetrics and Gynecology

## 2022-11-14 ENCOUNTER — Encounter: Payer: Self-pay | Admitting: Family Medicine

## 2022-11-14 ENCOUNTER — Other Ambulatory Visit: Payer: Medicaid Other

## 2022-11-14 ENCOUNTER — Encounter: Payer: Medicaid Other | Admitting: Family Medicine

## 2022-11-14 NOTE — Progress Notes (Signed)
Patient did not keep appointment today. She will be called to reschedule.  

## 2022-11-29 ENCOUNTER — Encounter: Payer: Self-pay | Admitting: *Deleted

## 2022-11-29 DIAGNOSIS — O99210 Obesity complicating pregnancy, unspecified trimester: Secondary | ICD-10-CM | POA: Insufficient documentation

## 2022-11-29 DIAGNOSIS — O0930 Supervision of pregnancy with insufficient antenatal care, unspecified trimester: Secondary | ICD-10-CM | POA: Insufficient documentation

## 2022-12-01 ENCOUNTER — Ambulatory Visit: Payer: Medicaid Other | Attending: Obstetrics and Gynecology

## 2022-12-01 ENCOUNTER — Ambulatory Visit: Payer: Medicaid Other

## 2023-01-03 ENCOUNTER — Inpatient Hospital Stay (HOSPITAL_COMMUNITY)
Admission: AD | Admit: 2023-01-03 | Discharge: 2023-01-03 | Disposition: A | Discharge status: Home / Self Care | Payer: Medicaid Other | Attending: Obstetrics & Gynecology | Admitting: Obstetrics & Gynecology

## 2023-01-03 ENCOUNTER — Encounter (HOSPITAL_COMMUNITY): Payer: Self-pay | Admitting: Obstetrics & Gynecology

## 2023-01-03 DIAGNOSIS — O479 False labor, unspecified: Secondary | ICD-10-CM | POA: Diagnosis not present

## 2023-01-03 DIAGNOSIS — O26893 Other specified pregnancy related conditions, third trimester: Secondary | ICD-10-CM | POA: Diagnosis present

## 2023-01-03 DIAGNOSIS — R030 Elevated blood-pressure reading, without diagnosis of hypertension: Secondary | ICD-10-CM | POA: Insufficient documentation

## 2023-01-03 DIAGNOSIS — O471 False labor at or after 37 completed weeks of gestation: Secondary | ICD-10-CM | POA: Insufficient documentation

## 2023-01-03 DIAGNOSIS — Z3A37 37 weeks gestation of pregnancy: Secondary | ICD-10-CM | POA: Diagnosis not present

## 2023-01-03 LAB — URINALYSIS, ROUTINE W REFLEX MICROSCOPIC
Bilirubin Urine: NEGATIVE
Glucose, UA: NEGATIVE mg/dL
Hgb urine dipstick: NEGATIVE
Ketones, ur: NEGATIVE mg/dL
Nitrite: NEGATIVE
Protein, ur: 30 mg/dL — AB
Specific Gravity, Urine: 1.021 (ref 1.005–1.030)
pH: 6 (ref 5.0–8.0)

## 2023-01-03 LAB — COMPREHENSIVE METABOLIC PANEL
ALT: 13 U/L (ref 0–44)
AST: 19 U/L (ref 15–41)
Albumin: 2.9 g/dL — ABNORMAL LOW (ref 3.5–5.0)
Alkaline Phosphatase: 112 U/L (ref 38–126)
Anion gap: 10 (ref 5–15)
BUN: 6 mg/dL (ref 6–20)
CO2: 20 mmol/L — ABNORMAL LOW (ref 22–32)
Calcium: 8.9 mg/dL (ref 8.9–10.3)
Chloride: 107 mmol/L (ref 98–111)
Creatinine, Ser: 0.71 mg/dL (ref 0.44–1.00)
GFR, Estimated: >60 mL/min (ref >60)
Glucose, Bld: 93 mg/dL (ref 70–99)
Potassium: 3.7 mmol/L (ref 3.5–5.1)
Sodium: 137 mmol/L (ref 135–145)
Total Bilirubin: 0.4 mg/dL (ref 0.3–1.2)
Total Protein: 6.8 g/dL (ref 6.5–8.1)

## 2023-01-03 LAB — CBC
HCT: 30.5 % — ABNORMAL LOW (ref 36.0–46.0)
Hemoglobin: 10.1 g/dL — ABNORMAL LOW (ref 12.0–15.0)
MCH: 26.9 pg (ref 26.0–34.0)
MCHC: 33.1 g/dL (ref 30.0–36.0)
MCV: 81.1 fL (ref 80.0–100.0)
Platelets: 276 10*3/uL (ref 150–400)
RBC: 3.76 MIL/uL — ABNORMAL LOW (ref 3.87–5.11)
RDW: 15.2 % (ref 11.5–15.5)
WBC: 8.2 10*3/uL (ref 4.0–10.5)
nRBC: 0 % (ref 0.0–0.2)

## 2023-01-03 LAB — PROTEIN / CREATININE RATIO, URINE
Creatinine, Urine: 271 mg/dL
Protein Creatinine Ratio: 0.17 mg/mg{Cre} — ABNORMAL HIGH (ref 0.00–0.15)
Total Protein, Urine: 46 mg/dL

## 2023-01-03 MED ORDER — ONDANSETRON 8 MG PO TBDP: 8.0000 mg | ORAL_TABLET | Freq: Three times a day (TID) | ORAL | 0 refills | Status: AC | PRN

## 2023-01-03 MED ORDER — ONDANSETRON 4 MG PO TBDP: 8.0000 mg | ORAL_TABLET | Freq: Once | ORAL | Status: DC

## 2023-01-03 MED ORDER — LACTATED RINGERS IV BOLUS
1000.0000 mL | Freq: Once | INTRAVENOUS | Status: AC
  Administered 2023-01-03: 1000 mL via INTRAVENOUS

## 2023-01-03 MED ORDER — OXYCODONE-ACETAMINOPHEN 5-325 MG PO TABS: 2.0000 | ORAL_TABLET | Freq: Once | ORAL | Status: DC

## 2023-01-03 MED ORDER — FENTANYL CITRATE (PF) 100 MCG/2ML IJ SOLN
100.0000 ug | Freq: Once | INTRAMUSCULAR | Status: AC
  Administered 2023-01-03: 100 ug via INTRAVENOUS
  Filled 2023-01-03: qty 2
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
## 2023-01-05 ENCOUNTER — Encounter (HOSPITAL_COMMUNITY): Payer: Self-pay | Admitting: Obstetrics & Gynecology

## 2023-01-05 ENCOUNTER — Ambulatory Visit (INDEPENDENT_AMBULATORY_CARE_PROVIDER_SITE_OTHER): Payer: Medicaid Other

## 2023-01-05 ENCOUNTER — Other Ambulatory Visit: Payer: Self-pay

## 2023-01-05 ENCOUNTER — Ambulatory Visit (INDEPENDENT_AMBULATORY_CARE_PROVIDER_SITE_OTHER): Payer: Medicaid Other | Admitting: Family Medicine

## 2023-01-05 ENCOUNTER — Inpatient Hospital Stay (HOSPITAL_COMMUNITY)
Admission: AD | Admit: 2023-01-05 | Discharge: 2023-01-08 | DRG: 807 | Disposition: A | Discharge status: Home / Self Care | Payer: Medicaid Other | Attending: Obstetrics and Gynecology | Admitting: Obstetrics and Gynecology

## 2023-01-05 VITALS — BP 134/90 | HR 89 | Wt 183.0 lb

## 2023-01-05 DIAGNOSIS — Z3480 Encounter for supervision of other normal pregnancy, unspecified trimester: Secondary | ICD-10-CM

## 2023-01-05 DIAGNOSIS — O134 Gestational [pregnancy-induced] hypertension without significant proteinuria, complicating childbirth: Secondary | ICD-10-CM | POA: Diagnosis not present

## 2023-01-05 DIAGNOSIS — O4202 Full-term premature rupture of membranes, onset of labor within 24 hours of rupture: Secondary | ICD-10-CM | POA: Diagnosis not present

## 2023-01-05 DIAGNOSIS — O99210 Obesity complicating pregnancy, unspecified trimester: Secondary | ICD-10-CM | POA: Diagnosis present

## 2023-01-05 DIAGNOSIS — O9902 Anemia complicating childbirth: Secondary | ICD-10-CM | POA: Diagnosis present

## 2023-01-05 DIAGNOSIS — O133 Gestational [pregnancy-induced] hypertension without significant proteinuria, third trimester: Secondary | ICD-10-CM

## 2023-01-05 DIAGNOSIS — O99214 Obesity complicating childbirth: Secondary | ICD-10-CM | POA: Diagnosis present

## 2023-01-05 DIAGNOSIS — O368130 Decreased fetal movements, third trimester, not applicable or unspecified: Secondary | ICD-10-CM

## 2023-01-05 DIAGNOSIS — O4290 Premature rupture of membranes, unspecified as to length of time between rupture and onset of labor, unspecified weeks of gestation: Secondary | ICD-10-CM | POA: Diagnosis present

## 2023-01-05 DIAGNOSIS — O4292 Full-term premature rupture of membranes, unspecified as to length of time between rupture and onset of labor: Secondary | ICD-10-CM | POA: Diagnosis present

## 2023-01-05 DIAGNOSIS — Z3A37 37 weeks gestation of pregnancy: Secondary | ICD-10-CM

## 2023-01-05 DIAGNOSIS — Z87891 Personal history of nicotine dependence: Secondary | ICD-10-CM

## 2023-01-05 DIAGNOSIS — O139 Gestational [pregnancy-induced] hypertension without significant proteinuria, unspecified trimester: Secondary | ICD-10-CM | POA: Diagnosis present

## 2023-01-05 DIAGNOSIS — Z5329 Procedure and treatment not carried out because of patient's decision for other reasons: Secondary | ICD-10-CM | POA: Diagnosis not present

## 2023-01-05 DIAGNOSIS — O0930 Supervision of pregnancy with insufficient antenatal care, unspecified trimester: Secondary | ICD-10-CM

## 2023-01-05 LAB — OB RESULTS CONSOLE GBS: GBS: NEGATIVE

## 2023-01-05 MED ORDER — SOD CITRATE-CITRIC ACID 500-334 MG/5ML PO SOLN: 30.0000 mL | ORAL | Status: DC | PRN

## 2023-01-05 MED ORDER — LACTATED RINGERS IV SOLN: 500.0000 mL | INTRAVENOUS | Status: DC | PRN

## 2023-01-05 MED ORDER — OXYCODONE-ACETAMINOPHEN 5-325 MG PO TABS: 1.0000 | ORAL_TABLET | ORAL | Status: DC | PRN

## 2023-01-05 MED ORDER — LACTATED RINGERS IV SOLN: INTRAVENOUS | Status: DC

## 2023-01-05 MED ORDER — OXYTOCIN-SODIUM CHLORIDE 30-0.9 UT/500ML-% IV SOLN
2.5000 [IU]/h | INTRAVENOUS | Status: DC
  Filled 2023-01-05: qty 500

## 2023-01-05 MED ORDER — OXYCODONE-ACETAMINOPHEN 5-325 MG PO TABS: 2.0000 | ORAL_TABLET | ORAL | Status: DC | PRN

## 2023-01-05 MED ORDER — OXYTOCIN BOLUS FROM INFUSION
333.0000 mL | Freq: Once | INTRAVENOUS | Status: AC
  Administered 2023-01-06: 333 mL via INTRAVENOUS

## 2023-01-05 MED ORDER — MISOPROSTOL 50MCG HALF TABLET: 50.0000 ug | ORAL_TABLET | Freq: Once | ORAL | Status: DC

## 2023-01-05 MED ORDER — ONDANSETRON HCL 4 MG/2ML IJ SOLN
4.0000 mg | Freq: Four times a day (QID) | INTRAMUSCULAR | Status: DC | PRN
  Administered 2023-01-06: 4 mg via INTRAVENOUS
  Filled 2023-01-05: qty 2

## 2023-01-05 MED ORDER — LIDOCAINE HCL (PF) 1 % IJ SOLN: 30.0000 mL | INTRAMUSCULAR | Status: DC | PRN

## 2023-01-05 MED ORDER — FLEET ENEMA 7-19 GM/118ML RE ENEM: 1.0000 | ENEMA | RECTAL | Status: DC | PRN

## 2023-01-05 MED ORDER — ACETAMINOPHEN 325 MG PO TABS: 650.0000 mg | ORAL_TABLET | ORAL | Status: DC | PRN

## 2023-01-05 MED ORDER — MISOPROSTOL 25 MCG QUARTER TABLET: 25.0000 ug | ORAL_TABLET | Freq: Once | ORAL | Status: DC

## 2023-01-05 NOTE — MAU Note (Signed)
.  Pamela Brown is a 22 y.o. at [redacted]w[redacted]d here in MAU reporting SROM about before arrival. Pt's sweat pants are soaked with fld but unsure of fld color. Pants are tan. When asked about FM pt states she had a NST today because the baby was "not behaving". She is for IOL tomorrow night.   Onset of complaint: 2245 Pain score: 0 There were no vitals filed for this visit.   FHT:pt taken straight to room due to leaking fld Lab orders placed from triage:  labor eval

## 2023-01-05 NOTE — H&P (Signed)
Taniaya Noori is a 22 y.o. female G2P0010 with IUP at [redacted]w[redacted]d by 6 week [place] presenting for PROM at 2300 . Dx w/GHTN 2 days ago and scheduled for IOL in 2 days  Denies HA, blurred vision, RUQ pain. Started contracting shortly after, definitely getting stronger.   She reports positive fetal movement. She denies leakage of fluid or vaginal bleeding.  Prenatal History/Complications: PNC:  2 visits at [facility] 20 and 24 weeks            4 visits in [place] (care everywhere) 30-36 weeks  Moved back to [place] this week and seen at [facility] today   l Pregnancy complications:  - Past Medical History: Past Medical History:  Diagnosis Date   Anemia    Asthma    Headache    UTI (urinary tract infection)     Past Surgical History: Past Surgical History:  Procedure Laterality Date   THERAPEUTIC ABORTION      Obstetrical History: OB History     Gravida  2   Para  0   Term  0   Preterm  0   AB  1   Living  0      SAB  0   IAB  1   Ectopic  0   Multiple  0   Live Births  0            Social History: Social History   Socioeconomic History   Marital status: Single    Spouse name: Not on file   Number of children: Not on file   Years of education: Not on file   Highest education level: Not on file  Occupational History   Not on file  Tobacco Use   Smoking status: Former    Types: Cigars   Smokeless tobacco: Never   Tobacco comments:    Quit with +pareg  Vaping Use   Vaping status: Former   Substances: Nicotine, THC  Substance and Sexual Activity   Alcohol use: Not Currently   Drug use: Not Currently    Types: Marijuana    Comment: last 3rd wk inNov   Sexual activity: Yes    Birth control/protection: None  Other Topics Concern   Not on file  Social History Narrative   Not on file   Social Determinants of Health   Financial Resource Strain: Not on file  Food Insecurity: Not on file  Transportation Needs: Not on file  Physical Activity: Not on file  Stress: Not  on file  Social Connections: Unknown (09/04/2022)   Received from [employer], [facility]   Social Network    Social Network: Not on file    Family History: Family History  Problem Relation Age of Onset   Heart disease Mother    Other Father        unknown    Allergies: No Known Allergies  Medications Prior to Admission  Medication Sig Dispense Refill Last Dose   albuterol (PROVENTIL) (5 MG/ML) 0.5% nebulizer solution Take 2.5 mg by nebulization every 6 (six) hours as needed for wheezing or shortness of breath. (Patient not taking: Reported on 01/05/2023)      Blood Pressure Monitoring DEVI 1 each by Does not apply route once a week. (Patient not taking: Reported on 01/05/2023) 1 each 0    ondansetron (ZOFRAN-ODT) 8 MG disintegrating tablet Take 1 tablet (8 mg total) by mouth every 8 (eight) hours as needed for nausea or vomiting. (Patient not taking: Reported on 01/05/2023) 30  tablet 0    Prenatal MV & Min w/FA-DHA (PRENATAL GUMMIES PO) Take by mouth.       Review of Systems   Constitutional: Negative for fever and chills Eyes: Negative for visual disturbances Respiratory: Negative for shortness of breath, dyspnea Cardiovascular: Negative for chest pain or palpitations  Gastrointestinal: Negative for vomiting, diarrhea and constipation.  POSITIVE for abdominal pain (contractions) Genitourinary: Negative for dysuria and urgency Musculoskeletal: Negative for back pain, joint pain, myalgias  Neurological: Negative for dizziness and headaches  Blood pressure (!) 140/96, pulse 78, height 4\' 11"  (1.499 m), weight 83 kg, last menstrual period 04/15/2022. General appearance: alert, cooperative, and no distress Lungs: normal respiratory effort Heart: regular rate and rhythm Abdomen: soft, non-tender; bowel sounds normal Extremities: Homans sign is negative, no sign of DVT DTR's 2+ Presentation: cephalic Fetal monitoring  Baseline: 140 bpm, Variability: Good {> 6 bpm),  Accelerations: Reactive, and Decelerations: Absent Uterine activity  None     Prenatal labs: ABO, Rh: O/Positive/-- (01/17 0000) Antibody: Negative (01/17 0000) Rubella: Immune (01/17 0000) RPR: Nonreactive (01/17 0000)  HBsAg: Negative (01/17 0000)  HIV: Non Reactive (12/01 1430)  GBS:   negative  Nursing Staff Provider  Office Location [facility] Dating  01/20/2023, by Last Menstrual Period  [facility] Model [ X] Traditional [ ]  Centering [ ]  Mom-Baby Dyad    Language  English Anatomy US    Flu Vaccine   Genetic/Carrier Screen  NIPS:    AFP:    Horizon:  TDaP Vaccine    Hgb A1C or  GTT Early  Third trimester   COVID Vaccine    LAB RESULTS   Rhogam  O/Positive/-- (01/17 0000)  Blood Type O/Positive/-- (01/17 0000)   Baby Feeding Plan Breast Antibody Negative (01/17 0000)  Contraception None Rubella Immune (01/17 0000)  Circumcision Yes RPR Nonreactive (01/17 0000)   Pediatrician  List given HBsAg Negative (01/17 0000)   Support Person Jaquan(FOB) HCVAb Negative (01/17 0000)   Prenatal Classes  HIV Non Reactive (12/01 1430)     BTL Consent NA GBS  Negative in [place] - see Care Everywhere  VBAC Consent NA Pap No results found for: "DIAGPAP"       DME Rx [ X] BP cuff [ ]  Weight Scale Waterbirth  [ ]  Class [ ]  Consent [ ]  CNM visit  PHQ9 & GAD7 [ X ] new OB [  ] 28 weeks  [  ] 36 weeks Induction  [ ]  Orders Entered [ ] Foley Y/N    Prenatal Transfer Tool  Maternal Diabetes: No Genetic Screening: Declined Maternal Ultrasounds/Referrals: Normal Fetal Ultrasounds or other Referrals:  None Maternal Substance Abuse:  No Significant Maternal Medications:  None Significant Maternal Lab Results: Group B Strep negative  No results found for this or any previous visit (from the past 24 hour(s)).  Assessment: Marcene Wirsing is a 22 y.o. G2P0010 with an IUP at [redacted]w[redacted]d presenting for PROM; GHTN  Plan: #Labor: expectant mgt #Pain:  Per request #FWB Cat 1 #ID: GBS: neg  #MOF:   breast #MOC: none #Circ: yes   Jacklyn Shell 01/06/2023, 12:02 AM

## 2023-01-05 NOTE — Progress Notes (Signed)
   PRENATAL VISIT NOTE  Subjective:  Pamela Brown is a 22 y.o. G2P0010 at [redacted]w[redacted]d being seen today for ongoing prenatal care.  She is currently monitored for the following issues for this high-risk pregnancy and has Supervision of other normal pregnancy, antepartum; Asthma; Limited prenatal care; and Pregnancy complicated by obesity on their problem list.  Patient reports  not feeling well .  Contractions: Irregular.  .  Movement: (!) Decreased. Denies leaking of fluid.   The following portions of the patient's history were reviewed and updated as appropriate: allergies, current medications, past family history, past medical history, past social history, past surgical history and problem list.   Objective:   Vitals:   01/05/23 1434 01/05/23 1443  BP: (!) 120/96 (!) 134/90  Pulse: 95 89  Weight: 183 lb (83 kg)     Fetal Status: Fetal Heart Rate (bpm): 147   Movement: (!) Decreased     General:  Alert, oriented and cooperative. Patient is in no acute distress.  Skin: Skin is warm and dry. No rash noted.   Cardiovascular: Normal heart rate noted  Respiratory: Normal respiratory effort, no problems with respiration noted  Abdomen: Soft, gravid, appropriate for gestational age.  Pain/Pressure: Present     Pelvic: Cervical exam deferred        Extremities: Normal range of motion.     Mental Status: Normal mood and affect. Normal behavior. Normal judgment and thought content.   Assessment and Plan:  Pregnancy: G2P0010 at [redacted]w[redacted]d 1. Supervision of other normal pregnancy, antepartum GBS is negative per Care Everywhere  2. Decreased fetal movements in third trimester, single or unspecified fetus Reassuring fetal testing today - Fetal nonstress test; Future - US FETAL BPP W/NONSTRESS; Future  3. Gestational Hypertension With decreased FM For IOL in 2 days. Normal labs 2 days ago.  Term labor symptoms and general obstetric precautions including but not limited to vaginal bleeding,  contractions, leaking of fluid and fetal movement were reviewed in detail with the patient. Please refer to After Visit Summary for other counseling recommendations.   Return in 1 week (on 01/12/2023).  No future appointments.  Reva Bores, MD

## 2023-01-06 ENCOUNTER — Inpatient Hospital Stay (HOSPITAL_COMMUNITY): Payer: Medicaid Other | Admitting: Anesthesiology

## 2023-01-06 ENCOUNTER — Encounter (HOSPITAL_COMMUNITY): Payer: Self-pay | Admitting: Family Medicine

## 2023-01-06 ENCOUNTER — Other Ambulatory Visit: Payer: Self-pay

## 2023-01-06 DIAGNOSIS — Z3A37 37 weeks gestation of pregnancy: Secondary | ICD-10-CM

## 2023-01-06 DIAGNOSIS — O4202 Full-term premature rupture of membranes, onset of labor within 24 hours of rupture: Secondary | ICD-10-CM

## 2023-01-06 DIAGNOSIS — O134 Gestational [pregnancy-induced] hypertension without significant proteinuria, complicating childbirth: Secondary | ICD-10-CM

## 2023-01-06 LAB — CBC
HCT: 31.4 % — ABNORMAL LOW (ref 36.0–46.0)
Hemoglobin: 10 g/dL — ABNORMAL LOW (ref 12.0–15.0)
MCH: 26.6 pg (ref 26.0–34.0)
MCHC: 31.8 g/dL (ref 30.0–36.0)
MCV: 83.5 fL (ref 80.0–100.0)
Platelets: 279 10*3/uL (ref 150–400)
RBC: 3.76 MIL/uL — ABNORMAL LOW (ref 3.87–5.11)
RDW: 15.4 % (ref 11.5–15.5)
WBC: 8.6 10*3/uL (ref 4.0–10.5)
nRBC: 0 % (ref 0.0–0.2)

## 2023-01-06 LAB — TYPE AND SCREEN
ABO/RH(D): O POS
Antibody Screen: NEGATIVE

## 2023-01-06 LAB — RPR: RPR Ser Ql: NONREACTIVE

## 2023-01-06 MED ORDER — DIPHENHYDRAMINE HCL 25 MG PO CAPS: 25.0000 mg | ORAL_CAPSULE | Freq: Four times a day (QID) | ORAL | Status: DC | PRN

## 2023-01-06 MED ORDER — ACETAMINOPHEN 325 MG PO TABS
650.0000 mg | ORAL_TABLET | ORAL | Status: DC | PRN
  Administered 2023-01-06 – 2023-01-07 (×2): 650 mg via ORAL
  Filled 2023-01-06 (×2): qty 2

## 2023-01-06 MED ORDER — TETANUS-DIPHTH-ACELL PERTUSSIS 5-2.5-18.5 LF-MCG/0.5 IM SUSY: 0.5000 mL | PREFILLED_SYRINGE | Freq: Once | INTRAMUSCULAR | Status: DC

## 2023-01-06 MED ORDER — BENZOCAINE-MENTHOL 20-0.5 % EX AERO
1.0000 | INHALATION_SPRAY | CUTANEOUS | Status: DC | PRN
  Administered 2023-01-06: 1 via TOPICAL
  Filled 2023-01-06: qty 56

## 2023-01-06 MED ORDER — WITCH HAZEL-GLYCERIN EX PADS
1.0000 | MEDICATED_PAD | CUTANEOUS | Status: DC | PRN
  Administered 2023-01-06: 1 via TOPICAL

## 2023-01-06 MED ORDER — EPHEDRINE 5 MG/ML INJ
10.0000 mg | INTRAVENOUS | Status: DC | PRN
  Filled 2023-01-06: qty 5

## 2023-01-06 MED ORDER — PHENYLEPHRINE 80 MCG/ML (10ML) SYRINGE FOR IV PUSH (FOR BLOOD PRESSURE SUPPORT): 80.0000 ug | PREFILLED_SYRINGE | INTRAVENOUS | Status: DC | PRN

## 2023-01-06 MED ORDER — DIBUCAINE (PERIANAL) 1 % EX OINT: 1.0000 | TOPICAL_OINTMENT | CUTANEOUS | Status: DC | PRN

## 2023-01-06 MED ORDER — DIPHENHYDRAMINE HCL 50 MG/ML IJ SOLN: 12.5000 mg | INTRAMUSCULAR | Status: DC | PRN

## 2023-01-06 MED ORDER — ZOLPIDEM TARTRATE 5 MG PO TABS: 5.0000 mg | ORAL_TABLET | Freq: Every evening | ORAL | Status: DC | PRN

## 2023-01-06 MED ORDER — NIFEDIPINE ER OSMOTIC RELEASE 30 MG PO TB24
30.0000 mg | ORAL_TABLET | Freq: Every day | ORAL | Status: DC
  Administered 2023-01-06 – 2023-01-08 (×3): 30 mg via ORAL
  Filled 2023-01-06 (×3): qty 1

## 2023-01-06 MED ORDER — FUROSEMIDE 20 MG PO TABS
20.0000 mg | ORAL_TABLET | Freq: Every day | ORAL | Status: DC
  Administered 2023-01-06 – 2023-01-08 (×3): 20 mg via ORAL
  Filled 2023-01-06 (×3): qty 1

## 2023-01-06 MED ORDER — FENTANYL CITRATE (PF) 100 MCG/2ML IJ SOLN
INTRAMUSCULAR | Status: AC
  Filled 2023-01-06: qty 2

## 2023-01-06 MED ORDER — FENTANYL CITRATE (PF) 100 MCG/2ML IJ SOLN
50.0000 ug | INTRAMUSCULAR | Status: DC | PRN
  Administered 2023-01-06: 50 ug via INTRAVENOUS

## 2023-01-06 MED ORDER — LACTATED RINGERS IV SOLN: INTRAVENOUS | Status: DC

## 2023-01-06 MED ORDER — IBUPROFEN 600 MG PO TABS
600.0000 mg | ORAL_TABLET | Freq: Four times a day (QID) | ORAL | Status: DC
  Administered 2023-01-07 – 2023-01-08 (×7): 600 mg via ORAL
  Filled 2023-01-06 (×7): qty 1

## 2023-01-06 MED ORDER — OXYTOCIN-SODIUM CHLORIDE 30-0.9 UT/500ML-% IV SOLN
1.0000 m[IU]/min | INTRAVENOUS | Status: DC
  Administered 2023-01-06: 2 m[IU]/min via INTRAVENOUS

## 2023-01-06 MED ORDER — EPHEDRINE 5 MG/ML INJ: 10.0000 mg | INTRAVENOUS | Status: DC | PRN

## 2023-01-06 MED ORDER — SIMETHICONE 80 MG PO CHEW: 80.0000 mg | CHEWABLE_TABLET | ORAL | Status: DC | PRN

## 2023-01-06 MED ORDER — LIDOCAINE HCL (PF) 1 % IJ SOLN
INTRAMUSCULAR | Status: DC | PRN
  Administered 2023-01-06: 10 mL via EPIDURAL

## 2023-01-06 MED ORDER — PHENYLEPHRINE 80 MCG/ML (10ML) SYRINGE FOR IV PUSH (FOR BLOOD PRESSURE SUPPORT)
80.0000 ug | PREFILLED_SYRINGE | INTRAVENOUS | Status: DC | PRN
  Filled 2023-01-06: qty 10

## 2023-01-06 MED ORDER — COCONUT OIL OIL
1.0000 | TOPICAL_OIL | Status: DC | PRN
  Administered 2023-01-06 – 2023-01-08 (×2): 1 via TOPICAL

## 2023-01-06 MED ORDER — SENNOSIDES-DOCUSATE SODIUM 8.6-50 MG PO TABS
2.0000 | ORAL_TABLET | ORAL | Status: DC
  Administered 2023-01-06 – 2023-01-07 (×2): 2 via ORAL
  Filled 2023-01-06 (×2): qty 2

## 2023-01-06 MED ORDER — ONDANSETRON HCL 4 MG PO TABS: 4.0000 mg | ORAL_TABLET | ORAL | Status: DC | PRN

## 2023-01-06 MED ORDER — PRENATAL MULTIVITAMIN CH
1.0000 | ORAL_TABLET | Freq: Every day | ORAL | Status: DC
  Administered 2023-01-07 – 2023-01-08 (×2): 1 via ORAL
  Filled 2023-01-06 (×2): qty 1

## 2023-01-06 MED ORDER — FENTANYL-BUPIVACAINE-NACL 0.5-0.125-0.9 MG/250ML-% EP SOLN
12.0000 mL/h | EPIDURAL | Status: DC | PRN
  Administered 2023-01-06: 12 mL/h via EPIDURAL
  Filled 2023-01-06: qty 250

## 2023-01-06 MED ORDER — ONDANSETRON HCL 4 MG/2ML IJ SOLN: 4.0000 mg | INTRAMUSCULAR | Status: DC | PRN

## 2023-01-06 MED ORDER — LACTATED RINGERS IV SOLN: 500.0000 mL | Freq: Once | INTRAVENOUS | Status: DC

## 2023-01-06 MED ORDER — TERBUTALINE SULFATE 1 MG/ML IJ SOLN: 0.2500 mg | Freq: Once | INTRAMUSCULAR | Status: DC | PRN

## 2023-01-06 NOTE — H&P (Incomplete)
Pamela Brown is a 22 y.o. female G2P0010 with IUP at [redacted]w[redacted]d by 6 week [place] presenting for PROM at 2300 . Dx w/GHTN 2 days ago and scheduled for IOL in 2 days  Denies HA, blurred vision, RUQ pain.  Not contracting. ***.  She reports positive fetal movement. She denies leakage of fluid or vaginal bleeding.  Prenatal History/Complications: PNC at [facility] Pregnancy complications:  - ***Past Medical History: Past Medical History:  Diagnosis Date  . Anemia   . Asthma   . Headache   . UTI (urinary tract infection)     Past Surgical History: Past Surgical History:  Procedure Laterality Date  . THERAPEUTIC ABORTION      Obstetrical History: OB History     Gravida  2   Para  0   Term  0   Preterm  0   AB  1   Living  0      SAB  0   IAB  1   Ectopic  0   Multiple  0   Live Births  0            Social History: Social History   Socioeconomic History  . Marital status: Single    Spouse name: Not on file  . Number of children: Not on file  . Years of education: Not on file  . Highest education level: Not on file  Occupational History  . Not on file  Tobacco Use  . Smoking status: Former    Types: Cigars  . Smokeless tobacco: Never  . Tobacco comments:    Quit with +pareg  Vaping Use  . Vaping status: Former  . Substances: Nicotine, THC  Substance and Sexual Activity  . Alcohol use: Not Currently  . Drug use: Not Currently    Types: Marijuana    Comment: last 3rd wk inNov  . Sexual activity: Yes    Birth control/protection: None  Other Topics Concern  . Not on file  Social History Narrative  . Not on file   Social Determinants of Health   Financial Resource Strain: Not on file  Food Insecurity: Not on file  Transportation Needs: Not on file  Physical Activity: Not on file  Stress: Not on file  Social Connections: Unknown (09/04/2022)   Received from [facility]   Social Network   . Social Network: Not on file    Family  History: Family History  Problem Relation Age of Onset  . Heart disease Mother   . Other Father        unknown    Allergies: No Known Allergies  Medications Prior to Admission  Medication Sig Dispense Refill Last Dose  . albuterol (PROVENTIL) (5 MG/ML) 0.5% nebulizer solution Take 2.5 mg by nebulization every 6 (six) hours as needed for wheezing or shortness of breath. (Patient not taking: Reported on 01/05/2023)     . Blood Pressure Monitoring DEVI 1 each by Does not apply route once a week. (Patient not taking: Reported on 01/05/2023) 1 each 0   . ondansetron (ZOFRAN-ODT) 8 MG disintegrating tablet Take 1 tablet (8 mg total) by mouth every 8 (eight) hours as needed for nausea or vomiting. (Patient not taking: Reported on 01/05/2023) 30 tablet 0   . Prenatal MV & Min w/FA-DHA (PRENATAL GUMMIES PO) Take by mouth.       Review of Systems   Constitutional: Negative for fever and chills Eyes: Negative for visual disturbances Respiratory: Negative for shortness of breath, dyspnea  Cardiovascular: Negative for chest pain or palpitations  Gastrointestinal: Negative for vomiting, diarrhea and constipation.  POSITIVE for abdominal pain (contractions) Genitourinary: Negative for dysuria and urgency Musculoskeletal: Negative for back pain, joint pain, myalgias  Neurological: Negative for dizziness and headaches  Blood pressure (!) 140/96, pulse 78, height 4\' 11"  (1.499 m), weight 83 kg, last menstrual period 04/15/2022. General appearance: {general exam:16600} Lungs: normal respiratory effort Heart: regular rate and rhythm Abdomen: soft, non-tender; bowel sounds normal Extremities: Homans sign is negative, no sign of DVT DTR's 2+ Presentation: {desc; fetal presentation:14558} Fetal monitoring  {findings; monitor fetal heart monitor:31527} Uterine activity  {Uterine contractions:31516}     Prenatal labs: ABO, Rh: O/Positive/-- (01/17 0000) Antibody: Negative (01/17 0000) Rubella:  Immune (01/17 0000) RPR: Nonreactive (01/17 0000)  HBsAg: Negative (01/17 0000)  HIV: Non Reactive (12/01 1430)  GBS:   negative  Nursing Staff Provider  Office Location [facility] Dating  01/20/2023, by Last Menstrual Period  [facility] Model [ X] Traditional [ ]  Centering [ ]  Mom-Baby Dyad    Language  English Anatomy US    Flu Vaccine   Genetic/Carrier Screen  NIPS:    AFP:    Horizon:  TDaP Vaccine    Hgb A1C or  GTT Early  Third trimester   COVID Vaccine    LAB RESULTS   Rhogam  O/Positive/-- (01/17 0000)  Blood Type O/Positive/-- (01/17 0000)   Baby Feeding Plan Breast Antibody Negative (01/17 0000)  Contraception None Rubella Immune (01/17 0000)  Circumcision Yes RPR Nonreactive (01/17 0000)   Pediatrician  List given HBsAg Negative (01/17 0000)   Support Person Jaquan(FOB) HCVAb Negative (01/17 0000)   Prenatal Classes  HIV Non Reactive (12/01 1430)     BTL Consent NA GBS  Negative in [place] - see Care Everywhere  VBAC Consent NA Pap No results found for: "DIAGPAP"       DME Rx [ X] BP cuff [ ]  Weight Scale Waterbirth  [ ]  Class [ ]  Consent [ ]  CNM visit  PHQ9 & GAD7 [ X ] new OB [  ] 28 weeks  [  ] 36 weeks Induction  [ ]  Orders Entered [ ] Foley Y/N    Prenatal Transfer Tool  Maternal Diabetes: {Maternal Diabetes:3043596} Genetic Screening: {Genetic Screening:20205} Maternal Ultrasounds/Referrals: {Maternal Ultrasounds / Referrals:20211} Fetal Ultrasounds or other Referrals:  {Fetal Ultrasounds or Other Referrals:20213} Maternal Substance Abuse:  {Maternal Substance Abuse:20223} Significant Maternal Medications:  {Significant Maternal Meds:20233} Significant Maternal Lab Results: {Significant Maternal Lab Results:20235}  No results found for this or any previous visit (from the past 24 hour(s)).  Assessment: Pamela Brown is a 22 y.o. G2P0010 with an IUP at [redacted]w[redacted]d presenting for ***  Plan: #Labor: expectant management #Pain:  Per request #FWB Cat 1 #ID:  GBS: ***  #MOF:  *** #MOC: *** #Circ: ***   Jacklyn Shell 01/06/2023, 12:02 AM

## 2023-01-06 NOTE — Lactation Note (Signed)
This note was copied from a baby's chart. Lactation Consultation Note  Patient Name: Pamela Brown [id number] Date: 01/06/2023 Age:23 hours Reason for consult: Initial assessment;1st time breastfeeding;Primapara;Early term 37-38.6wks P1, ETI female infant. Per Birth Parent, RN assisted with latch, infant was on and off the breast for 10 minutes and afterwards was supplement with 10 mls of 20 kcal formula. This is Birth Parent's current feeding choice. LC observed Birth Parent has areola edema and given hand pump with 21 mm breast flange to pre-pump breast prior to latching infant. LC discussed infant's input and output and the importance of maternal diet, rest and hydration. Birth Parent was  made aware of O/P services, breastfeeding support groups, community resources, and our phone # for post-discharge questions.    Current feeding plan:  1- Birth Parent will pre-pump breast with hand pump and then continue to BF infant according to hunger cues, on demand, 8 to 12+ times within 24 hours, skin to skin. 2- Birth Parent will continue to ask RN/LC for latch assistance if needed. 3- Birth Parent's feeding  choice after latching infant at the breast, Birth Parent will continue to supplement infant with formula  Maternal Data Has patient been taught Hand Expression?: Yes Does the patient have breastfeeding experience prior to this delivery?: No  Feeding Mother's Current Feeding Choice: Breast Milk and Formula  LATCH Score ( Latch assessment was done by RN) Latch: Repeated attempts needed to sustain latch, nipple held in mouth throughout feeding, stimulation needed to elicit sucking reflex.  Audible Swallowing: A few with stimulation  Type of Nipple: Flat (short nipple L>R)  Comfort (Breast/Nipple): Soft / non-tender  Hold (Positioning): Assistance needed to correctly position infant at breast and maintain latch.  LATCH Score: 6   Lactation Tools Discussed/Used Tools: Pump Breast pump  type: Manual Reason for Pumping: Help evert nipple shaft out more due to having short shafted nipples and areola edema Pumping frequency: Will pre-pump breast prior to latching infant at the breast.  Interventions Interventions: Skin to skin;Pace feeding;Education;Position options;Breast feeding basics reviewed;Breast compression;Reverse pressure;Hand express;LC Services brochure  Discharge Pump: DEBP;Personal  Consult Status Consult Status: Follow-up Date: 01/07/23 Follow-up type: In-patient    Frederico Hamman 01/06/2023, 11:40 PM

## 2023-01-06 NOTE — Progress Notes (Signed)
LABOR PROGRESS NOTE  Pamela Brown is a 22 y.o. G2P0010 at [redacted]w[redacted]d  admitted for PROM  Subjective: Comfortable at this time, no concerns.   Objective: BP 131/69   Pulse 62   Temp 98.5 F (36.9 C) (Oral)   Resp 16   Ht 4\' 11"  (1.499 m)   Wt 83 kg   LMP 04/15/2022   SpO2 98%   BMI 36.96 kg/m  or  Vitals:   01/06/23 1132 01/06/23 1202 01/06/23 1232 01/06/23 1302  BP: 134/75 102/80 (!) 91/50 131/69  Pulse: 73 70 (!) 101 62  Resp: 15 16 15 16   Temp:      TempSrc:      SpO2:      Weight:      Height:       Dilation: 8 Effacement (%): 100 Cervical Position: Middle Station: -1 Presentation: Vertex Exam by:: Ashley Mariner RNC FHT: baseline rate 140, moderate varibility, +acel, nodecel Toco: every 6 min   Labs: Lab Results  Component Value Date   WBC 8.6 01/05/2023   HGB 10.0 (L) 01/05/2023   HCT 31.4 (L) 01/05/2023   MCV 83.5 01/05/2023   PLT 279 01/05/2023    Patient Active Problem List   Diagnosis Date Noted   Gestational hypertension 01/05/2023   PROM (premature rupture of membranes) 01/05/2023   Limited prenatal care 11/29/2022   Pregnancy complicated by obesity 11/29/2022   Supervision of other normal pregnancy, antepartum 09/06/2022   Asthma 07/23/2022    Assessment / Plan: 22 y.o. G2P0010 at [redacted]w[redacted]d here for PROM  Labor: Will start pitocin due to infrequent contractions Fetal Wellbeing:  Cat 1 Pain Control:  Epidural  Anticipated MOD:  Vaginal   Derrel Nip, MD  OB Fellow  01/06/2023, 1:51 PM

## 2023-01-06 NOTE — Anesthesia Preprocedure Evaluation (Signed)
Anesthesia Evaluation  Patient identified by MRN, date of birth, ID band Patient awake    Reviewed: Allergy & Precautions, H&P , NPO status , Patient's Chart, lab work & pertinent test results  Airway Mallampati: II       Dental no notable dental hx.    Pulmonary former smoker   Pulmonary exam normal        Cardiovascular Normal cardiovascular exam     Neuro/Psych  negative psych ROS   GI/Hepatic negative GI ROS, Neg liver ROS,,,  Endo/Other  negative endocrine ROS    Renal/GU negative Renal ROS  negative genitourinary   Musculoskeletal negative musculoskeletal ROS (+)    Abdominal  (+) + obese  Peds  Hematology  (+) Blood dyscrasia, anemia   Anesthesia Other Findings   Reproductive/Obstetrics (+) Pregnancy                             Anesthesia Physical Anesthesia Plan  ASA: 2  Anesthesia Plan: Epidural   Post-op Pain Management:    Induction:   PONV Risk Score and Plan:   Airway Management Planned:   Additional Equipment:   Intra-op Plan:   Post-operative Plan:   Informed Consent: I have reviewed the patients History and Physical, chart, labs and discussed the procedure including the risks, benefits and alternatives for the proposed anesthesia with the patient or authorized representative who has indicated his/her understanding and acceptance.       Plan Discussed with:   Anesthesia Plan Comments:        Anesthesia Quick Evaluation

## 2023-01-06 NOTE — Discharge Summary (Signed)
Postpartum Discharge Summary  Date of Service updated***     Patient Name: Pamela Brown DOB: [birth year] MRN: [id number]  Date of admission: 01/05/2023 Delivery date:01/06/2023 Delivering provider: Celedonio Savage Date of discharge: 01/06/2023  Admitting diagnosis: Gestational hypertension [O13.9] Intrauterine pregnancy: [redacted]w[redacted]d     Secondary diagnosis:  Principal Problem:   Gestational hypertension Active Problems:   Supervision of other normal pregnancy, antepartum   Limited prenatal care   Pregnancy complicated by obesity   PROM (premature rupture of membranes)   Vaginal delivery  Additional problems: ***    Discharge diagnosis: Term Pregnancy Delivered and Gestational Hypertension                                              Post partum procedures:{Postpartum procedures:23558} Augmentation: Pitocin Complications: None  Hospital course: Onset of Labor With Vaginal Delivery      22 y.o. yo G2P1011 at [redacted]w[redacted]d was admitted in Latent Labor on 01/05/2023. Labor course was complicated bygHTN  Membrane Rupture Time/Date: 11:00 PM,01/05/2023  Delivery Method:Vaginal, Spontaneous Episiotomy: None Lacerations:  2nd degree Patient had a postpartum course complicated by ***.  She is ambulating, tolerating a regular diet, passing flatus, and urinating well. Patient is discharged home in stable condition on 01/06/23.  Newborn Data: Birth date:01/06/2023 Birth time:5:41 PM Gender:Female Living status:Living Apgars:9 ,9  Weight:3190 g  Magnesium Sulfate received: No BMZ received: No Rhophylac:N/A MMR:N/A T-DaP: ordered postpartum Flu: N/A Transfusion:{Transfusion received:30440034}  Physical exam  Vitals:   01/06/23 1801 01/06/23 1816 01/06/23 1831 01/06/23 1846  BP: 137/78 (!) 124/103 (!) 136/96 (!) 144/82  Pulse: 83 (!) 111 94 85  Resp: 17 16 17 16   Temp:      TempSrc:      SpO2:      Weight:      Height:       General: {Exam; general:21111117} Lochia: {Desc;  appropriate/inappropriate:30686::"appropriate"} Uterine Fundus: {Desc; firm/soft:30687} Incision: {Exam; incision:21111123} DVT Evaluation: {Exam; dvt:2111122} Labs: Lab Results  Component Value Date   WBC 8.6 01/05/2023   HGB 10.0 (L) 01/05/2023   HCT 31.4 (L) 01/05/2023   MCV 83.5 01/05/2023   PLT 279 01/05/2023      Latest Ref Rng & Units 01/03/2023    2:10 PM  CMP  Glucose 70 - 99 mg/dL 93   BUN 6 - 20 mg/dL 6   Creatinine 7.82 - 9.56 mg/dL 2.13   Sodium 086 - 578 mmol/L 137   Potassium 3.5 - 5.1 mmol/L 3.7   Chloride 98 - 111 mmol/L 107   CO2 22 - 32 mmol/L 20   Calcium 8.9 - 10.3 mg/dL 8.9   Total Protein 6.5 - 8.1 g/dL 6.8   Total Bilirubin 0.3 - 1.2 mg/dL 0.4   Alkaline Phos 38 - 126 U/L 112   AST 15 - 41 U/L 19   ALT 0 - 44 U/L 13    Edinburgh Score:     No data to display           After visit meds:  Allergies as of 01/06/2023   No Known Allergies   Med Rec must be completed prior to using this [facility]***        Discharge home in stable condition Infant Feeding: {Baby feeding:23562} Infant Disposition:{CHL IP OB HOME WITH IONGEX:52841} Discharge instruction: per After Visit Summary and Postpartum booklet. Activity: Advance as  tolerated. Pelvic rest for 6 weeks.  Diet: {OB ZOXW:96045409} Future Appointments:No future appointments. Follow up Visit: Message sent   Please schedule this patient for a In person postpartum visit in 4 weeks with the following provider: Any provider. Additional Postpartum F/U:BP check 1 week  High risk pregnancy complicated by: HTN Delivery mode:  Vaginal, Spontaneous Anticipated Birth Control:  Declines    01/06/2023 Celedonio Savage, MD

## 2023-01-06 NOTE — MAU Note (Signed)
Pt informed that the ultrasound is considered a limited OB ultrasound and is not intended to be a complete ultrasound exam.  Patient also informed that the ultrasound is not being completed with the intent of assessing for fetal or placental anomalies or any pelvic abnormalities.  Explained that the purpose of today's ultrasound is to assess for  presentation.  Patient acknowledges the purpose of the exam and the limitations of the study.    Verified Vertex.    

## 2023-01-06 NOTE — Anesthesia Procedure Notes (Signed)
Epidural Patient location during procedure: OB Start time: 01/06/2023 4:10 AM End time: 01/06/2023 4:13 AM  Staffing Anesthesiologist: Leilani Able, MD Performed: anesthesiologist   Preanesthetic Checklist Completed: patient identified, IV checked, site marked, risks and benefits discussed, surgical consent, monitors and equipment checked, pre-op evaluation and timeout performed  Epidural Patient position: sitting Prep: DuraPrep and site prepped and draped Patient monitoring: continuous pulse ox and blood pressure Approach: midline Location: L3-L4 Injection technique: LOR air  Needle:  Needle type: Tuohy  Needle gauge: 17 G Needle length: 9 cm and 9 Needle insertion depth: 6 cm Catheter type: closed end flexible Catheter size: 19 Gauge Catheter at skin depth: 11 cm Test dose: negative and Other  Assessment Events: blood not aspirated, no cerebrospinal fluid, injection not painful, no injection resistance, no paresthesia and negative IV test  Additional Notes Reason for block:procedure for pain

## 2023-01-07 ENCOUNTER — Inpatient Hospital Stay (HOSPITAL_COMMUNITY)
Admission: RE | Admit: 2023-01-07 | Payer: Medicaid Other | Source: Home / Self Care | Admitting: Obstetrics & Gynecology

## 2023-01-07 ENCOUNTER — Inpatient Hospital Stay (HOSPITAL_COMMUNITY): Payer: Medicaid Other

## 2023-01-07 MED ORDER — FERROUS SULFATE 325 (65 FE) MG PO TABS
325.0000 mg | ORAL_TABLET | ORAL | Status: DC
  Administered 2023-01-07: 325 mg via ORAL
  Filled 2023-01-07: qty 1

## 2023-01-07 NOTE — Lactation Note (Addendum)
This note was copied from a baby's chart. Lactation Consultation Note  Patient Name: Pamela Brown Date: 01/07/2023 Age:23 hours Reason for consult: 1st time breastfeeding;Primapara;Early term 37-38.6wks;Follow-up assessment (Infant weight loss -0.94%) Per Birth Parent, she feels infant is latching well and she doesn't have any questions or concerns for LC at this time, she is still doing combination feeding or breast and formula. Infant had formula 15 mls at 1630 pm today was circumcised but she is planning do more breastfeeding than formula. Infant had 2 stools and one void since 01:00 am today. Birth Parent plans to continue to BF infant by cues on demand, 8 to 12+ times within 24 hours, skin to skin and if she chooses she will offer Day 2 after latch (7-12 mls) of formula. LC reviewed importance of maternal rest, diet and hydration.   Maternal Data    Feeding Mother's Current Feeding Choice: Breast Milk and Formula  LATCH Score  LC did not observe latch, infant received 15 mls of formula at 1630 pm today.                  Lactation Tools Discussed/Used    Interventions Interventions: Education;Pace feeding;Position options  Discharge    Consult Status Consult Status: Follow-up Date: 01/08/23 Follow-up type: In-patient    Frederico Hamman 01/07/2023, 6:06 PM

## 2023-01-07 NOTE — Anesthesia Postprocedure Evaluation (Signed)
Anesthesia Post Note  Patient: Pamela Brown  Procedure(s) Performed: AN AD HOC LABOR EPIDURAL     Patient location during evaluation: Mother Baby Anesthesia Type: Epidural Level of consciousness: awake and alert and oriented Pain management: satisfactory to patient Vital Signs Assessment: post-procedure vital signs reviewed and stable Respiratory status: respiratory function stable Cardiovascular status: stable Postop Assessment: no headache, no backache, epidural receding, patient able to bend at knees, no signs of nausea or vomiting, adequate PO intake and able to ambulate Anesthetic complications: no   No notable events documented.  Last Vitals:  Vitals:   01/07/23 0515 01/07/23 0846  BP: 127/73 128/74  Pulse: 80 82  Resp: 18 18  Temp: 36.6 C 36.4 C  SpO2: 100% 99%    Last Pain:  Vitals:   01/07/23 0846  TempSrc: Oral  PainSc:    Pain Goal:                   Ellaree Gear

## 2023-01-07 NOTE — Progress Notes (Signed)
CSW received consult for limited prenatal care. Per chart review, MOB has had 6 verified prenatal care visits (3 in [place], [place] at [facility] and 3 at [facility] beginning at [redacted] weeks gestation). Per chart review, MOB has a hx of marijuana use 07/01/2021 but denies use of marijuana since she found out she was pregnant. Referral was screened out due to the following:  ~MOB had no documented substance use after initial prenatal visit/+UPT. ~MOB had no positive drug screens after initial prenatal visit/+UPT. ~Baby's UDS is negative.

## 2023-01-07 NOTE — Progress Notes (Signed)
CSW acknowledges consult and completed clinical assessment. Clinical documentation will follow.  There are no barriers to d/c.  Signed,  Norberto Sorenson, MSW, LCSWA, LCASA 01/07/2023    05:15pm

## 2023-01-07 NOTE — Progress Notes (Addendum)
POSTPARTUM PROGRESS NOTE  Post Partum Day 1  Subjective:  Pamela Brown is a 22 y.o. G2P1011 s/p VD at [redacted]w[redacted]d.  She reports she is doing well. No acute events overnight. She denies any problems with ambulating, voiding or po intake. Denies nausea or vomiting.  Pain is well controlled.  Lochia is minimal.  Objective: Blood pressure 128/74, pulse 82, temperature 97.6 F (36.4 C), temperature source Oral, resp. rate 18, height 4\' 11"  (1.499 m), weight 83 kg, last menstrual period 04/15/2022, SpO2 99%, unknown if currently breastfeeding.  Physical Exam:  General: alert, cooperative and no distress Chest: no respiratory distress Heart:regular rate, distal pulses intact Abdomen: soft, nontender,  Uterine Fundus: firm, appropriately tender DVT Evaluation: No calf swelling or tenderness Extremities: no edema Skin: warm, dry  Recent Labs    01/05/23 2359  HGB 10.0*  HCT 31.4*    Assessment/Plan: Pamela Brown is a 22 y.o. G2P1011 s/p VD at [redacted]w[redacted]d   PPD#1 - Doing well  Routine postpartum care gHTH: on procardia 30mg  XL and lasix 20mg  daily.  Blood pressures well controlled.  Mild anemia, asymptomatic: start PO iron every other day.  Contraception: declines Feeding: breast Dispo: Plan for discharge tomorrow.   LOS: 2 days   Pamela Evens MD MPH OB Fellow, Faculty Practice [facility] 01/07/2023

## 2023-01-08 MED ORDER — NIFEDIPINE ER 30 MG PO TB24: 30.0000 mg | ORAL_TABLET | Freq: Every day | ORAL | 1 refills | Status: AC

## 2023-01-08 MED ORDER — FUROSEMIDE 20 MG PO TABS: 20.0000 mg | ORAL_TABLET | Freq: Every day | ORAL | 0 refills | Status: AC

## 2023-01-08 MED ORDER — IBUPROFEN 600 MG PO TABS: 600.0000 mg | ORAL_TABLET | Freq: Four times a day (QID) | ORAL | 0 refills | Status: AC

## 2023-01-08 NOTE — Plan of Care (Signed)
Complete

## 2023-01-22 ENCOUNTER — Ambulatory Visit (HOSPITAL_COMMUNITY)
Admission: EM | Admit: 2023-01-22 | Discharge: 2023-01-23 | Disposition: A | Discharge status: Home / Self Care | Payer: Medicaid Other | Attending: Emergency Medicine | Admitting: Emergency Medicine

## 2023-01-22 ENCOUNTER — Encounter (HOSPITAL_COMMUNITY): Payer: Self-pay

## 2023-01-22 ENCOUNTER — Other Ambulatory Visit: Payer: Self-pay

## 2023-01-22 ENCOUNTER — Emergency Department (HOSPITAL_COMMUNITY): Payer: Medicaid Other

## 2023-01-22 DIAGNOSIS — I10 Essential (primary) hypertension: Secondary | ICD-10-CM | POA: Diagnosis not present

## 2023-01-22 DIAGNOSIS — Z87891 Personal history of nicotine dependence: Secondary | ICD-10-CM | POA: Diagnosis not present

## 2023-01-22 DIAGNOSIS — K812 Acute cholecystitis with chronic cholecystitis: Secondary | ICD-10-CM | POA: Diagnosis not present

## 2023-01-22 DIAGNOSIS — K8010 Calculus of gallbladder with chronic cholecystitis without obstruction: Secondary | ICD-10-CM | POA: Insufficient documentation

## 2023-01-22 DIAGNOSIS — K8020 Calculus of gallbladder without cholecystitis without obstruction: Secondary | ICD-10-CM

## 2023-01-22 LAB — COMPREHENSIVE METABOLIC PANEL
ALT: 19 U/L (ref 0–44)
AST: 20 U/L (ref 15–41)
Albumin: 3.5 g/dL (ref 3.5–5.0)
Alkaline Phosphatase: 92 U/L (ref 38–126)
Anion gap: 9 (ref 5–15)
BUN: 14 mg/dL (ref 6–20)
CO2: 23 mmol/L (ref 22–32)
Calcium: 9 mg/dL (ref 8.9–10.3)
Chloride: 104 mmol/L (ref 98–111)
Creatinine, Ser: 0.88 mg/dL (ref 0.44–1.00)
GFR, Estimated: >60 mL/min (ref >60)
Glucose, Bld: 104 mg/dL — ABNORMAL HIGH (ref 70–99)
Potassium: 3.7 mmol/L (ref 3.5–5.1)
Sodium: 136 mmol/L (ref 135–145)
Total Bilirubin: 0.3 mg/dL (ref 0.3–1.2)
Total Protein: 7.2 g/dL (ref 6.5–8.1)

## 2023-01-22 LAB — CBC WITH DIFFERENTIAL/PLATELET
Abs Immature Granulocytes: 0.02 10*3/uL (ref 0.00–0.07)
Basophils Absolute: 0.1 10*3/uL (ref 0.0–0.1)
Basophils Relative: 1 %
Eosinophils Absolute: 0.2 10*3/uL (ref 0.0–0.5)
Eosinophils Relative: 3 %
HCT: 32.9 % — ABNORMAL LOW (ref 36.0–46.0)
Hemoglobin: 10 g/dL — ABNORMAL LOW (ref 12.0–15.0)
Immature Granulocytes: 0 %
Lymphocytes Relative: 30 %
Lymphs Abs: 2 10*3/uL (ref 0.7–4.0)
MCH: 25.3 pg — ABNORMAL LOW (ref 26.0–34.0)
MCHC: 30.4 g/dL (ref 30.0–36.0)
MCV: 83.3 fL (ref 80.0–100.0)
Monocytes Absolute: 0.6 10*3/uL (ref 0.1–1.0)
Monocytes Relative: 8 %
Neutro Abs: 4 10*3/uL (ref 1.7–7.7)
Neutrophils Relative %: 58 %
Platelets: 402 10*3/uL — ABNORMAL HIGH (ref 150–400)
RBC: 3.95 MIL/uL (ref 3.87–5.11)
RDW: 14.5 % (ref 11.5–15.5)
WBC: 6.9 10*3/uL (ref 4.0–10.5)
nRBC: 0.3 % — ABNORMAL HIGH (ref 0.0–0.2)

## 2023-01-22 LAB — TROPONIN I (HIGH SENSITIVITY): Troponin I (High Sensitivity): 3 ng/L (ref <18)

## 2023-01-22 LAB — D-DIMER, QUANTITATIVE: D-Dimer, Quant: 0.29 ug/mL-FEU (ref 0.00–0.50)

## 2023-01-22 NOTE — ED Triage Notes (Signed)
Pt reports RUQ abdominal pain, points to area below her right rib as well; onset 1 week ago, associated with a headache. Denies n/v/d. She is 2 weeks post-partum.

## 2023-01-22 NOTE — ED Provider Triage Note (Signed)
Emergency Medicine Provider Triage Evaluation Note  Pamela Brown , a 22 y.o. female  was evaluated in triage.  Pt complains of upper abdominal pain, SOB and chest pain when taking deep breath. 2 weeks postpartum s/p vaginal birth. Also reports of painful urination. No blood.   Review of Systems  Positive: Chest pain, SOB Negative: N/V/D  Physical Exam  BP 132/74 (BP Location: Right Arm)   Pulse 95   Temp 98 F (36.7 C) (Oral)   Resp 16   Ht 4\' 11"  (1.499 m)   LMP 04/15/2022   SpO2 98%   Breastfeeding Yes   BMI 36.96 kg/m  Gen:   Awake, no distress   Resp:  Normal effort  MSK:   Moves extremities without difficulty  Other:   Medical Decision Making  Medically screening exam initiated at 9:21 PM.  Appropriate orders placed.  Jazell Stallcup was informed that the remainder of the evaluation will be completed by another provider, this initial triage assessment does not replace that evaluation, and the importance of remaining in the ED until their evaluation is complete.     Pete Pelt, [place] 01/22/23 2250

## 2023-01-23 ENCOUNTER — Emergency Department (HOSPITAL_COMMUNITY): Payer: Medicaid Other

## 2023-01-23 ENCOUNTER — Encounter (HOSPITAL_COMMUNITY)
Admission: EM | Disposition: A | Discharge status: Home / Self Care | Payer: Self-pay | Source: Home / Self Care | Attending: Emergency Medicine

## 2023-01-23 ENCOUNTER — Encounter (HOSPITAL_COMMUNITY): Payer: Self-pay

## 2023-01-23 ENCOUNTER — Other Ambulatory Visit: Payer: Self-pay

## 2023-01-23 ENCOUNTER — Emergency Department (EMERGENCY_DEPARTMENT_HOSPITAL): Payer: Medicaid Other

## 2023-01-23 DIAGNOSIS — K812 Acute cholecystitis with chronic cholecystitis: Secondary | ICD-10-CM

## 2023-01-23 HISTORY — PX: CHOLECYSTECTOMY: SHX55

## 2023-01-23 SURGERY — LAPAROSCOPIC CHOLECYSTECTOMY
Anesthesia: General | Site: Abdomen

## 2023-01-23 MED ORDER — BUPIVACAINE-EPINEPHRINE (PF) 0.25% -1:200000 IJ SOLN
INTRAMUSCULAR | Status: AC
  Filled 2023-01-23: qty 30

## 2023-01-23 MED ORDER — DEXAMETHASONE SODIUM PHOSPHATE 10 MG/ML IJ SOLN
INTRAMUSCULAR | Status: DC | PRN
  Administered 2023-01-23: 5 mg via INTRAVENOUS

## 2023-01-23 MED ORDER — ACETAMINOPHEN 325 MG PO TABS: 650.0000 mg | ORAL_TABLET | Freq: Four times a day (QID) | ORAL | Status: DC | PRN

## 2023-01-23 MED ORDER — HYDROMORPHONE HCL 1 MG/ML IJ SOLN: 0.5000 mg | INTRAMUSCULAR | Status: DC | PRN

## 2023-01-23 MED ORDER — SPY AGENT GREEN - (INDOCYANINE FOR INJECTION)
1.2500 mg | Freq: Once | INTRAMUSCULAR | Status: DC
  Filled 2023-01-23: qty 10

## 2023-01-23 MED ORDER — OXYCODONE HCL 5 MG PO TABS: 5.0000 mg | ORAL_TABLET | Freq: Four times a day (QID) | ORAL | Status: DC | PRN

## 2023-01-23 MED ORDER — ROCURONIUM BROMIDE 10 MG/ML (PF) SYRINGE
PREFILLED_SYRINGE | INTRAVENOUS | Status: DC | PRN
  Administered 2023-01-23: 40 mg via INTRAVENOUS
  Administered 2023-01-23: 10 mg via INTRAVENOUS

## 2023-01-23 MED ORDER — CEFAZOLIN SODIUM-DEXTROSE 2-4 GM/100ML-% IV SOLN: 2.0000 g | INTRAVENOUS | Status: DC

## 2023-01-23 MED ORDER — ACETAMINOPHEN 500 MG PO TABS
ORAL_TABLET | ORAL | Status: AC
  Administered 2023-01-23: 1000 mg via ORAL
  Filled 2023-01-23: qty 2

## 2023-01-23 MED ORDER — CHLORHEXIDINE GLUCONATE CLOTH 2 % EX PADS: 6.0000 | MEDICATED_PAD | Freq: Once | CUTANEOUS | Status: DC

## 2023-01-23 MED ORDER — CHLORHEXIDINE GLUCONATE 0.12 % MT SOLN: 15.0000 mL | Freq: Once | OROMUCOSAL | Status: AC

## 2023-01-23 MED ORDER — SODIUM CHLORIDE 0.9 % IV SOLN
2.0000 g | Freq: Every day | INTRAVENOUS | Status: DC
  Administered 2023-01-23: 2 g via INTRAVENOUS
  Filled 2023-01-23: qty 20

## 2023-01-23 MED ORDER — ONDANSETRON HCL 4 MG/2ML IJ SOLN: 4.0000 mg | Freq: Four times a day (QID) | INTRAMUSCULAR | Status: DC | PRN

## 2023-01-23 MED ORDER — PHENYLEPHRINE 80 MCG/ML (10ML) SYRINGE FOR IV PUSH (FOR BLOOD PRESSURE SUPPORT)
PREFILLED_SYRINGE | INTRAVENOUS | Status: DC | PRN
  Administered 2023-01-23: 160 ug via INTRAVENOUS
  Administered 2023-01-23 (×3): 80 ug via INTRAVENOUS

## 2023-01-23 MED ORDER — SODIUM CHLORIDE 0.9 % IR SOLN
Status: DC | PRN
  Administered 2023-01-23: 1

## 2023-01-23 MED ORDER — ONDANSETRON HCL 4 MG/2ML IJ SOLN
INTRAMUSCULAR | Status: DC | PRN
Start: 2023-01-23 — End: 2023-01-23
  Administered 2023-01-23: 4 mg via INTRAVENOUS

## 2023-01-23 MED ORDER — MIDAZOLAM HCL 2 MG/2ML IJ SOLN
INTRAMUSCULAR | Status: DC | PRN
  Administered 2023-01-23: 2 mg via INTRAVENOUS

## 2023-01-23 MED ORDER — 0.9 % SODIUM CHLORIDE (POUR BTL) OPTIME
TOPICAL | Status: DC | PRN
  Administered 2023-01-23: 1000 mL

## 2023-01-23 MED ORDER — PROPOFOL 10 MG/ML IV BOLUS
INTRAVENOUS | Status: DC | PRN
Start: 2023-01-23 — End: 2023-01-23
  Administered 2023-01-23: 130 mg via INTRAVENOUS

## 2023-01-23 MED ORDER — OXYCODONE HCL 5 MG PO TABS: 5.0000 mg | ORAL_TABLET | Freq: Four times a day (QID) | ORAL | 0 refills | Status: AC | PRN

## 2023-01-23 MED ORDER — ALBUTEROL SULFATE HFA 108 (90 BASE) MCG/ACT IN AERS
INHALATION_SPRAY | RESPIRATORY_TRACT | Status: DC | PRN
  Administered 2023-01-23: 2 via RESPIRATORY_TRACT

## 2023-01-23 MED ORDER — METRONIDAZOLE 500 MG/100ML IV SOLN
500.0000 mg | Freq: Two times a day (BID) | INTRAVENOUS | Status: DC
  Administered 2023-01-23: 500 mg via INTRAVENOUS
  Filled 2023-01-23: qty 100

## 2023-01-23 MED ORDER — LACTATED RINGERS IV SOLN: INTRAVENOUS | Status: DC

## 2023-01-23 MED ORDER — OXYCODONE HCL 5 MG PO TABS
5.0000 mg | ORAL_TABLET | Freq: Once | ORAL | Status: AC
  Administered 2023-01-23: 5 mg via ORAL

## 2023-01-23 MED ORDER — PROPOFOL 10 MG/ML IV BOLUS
INTRAVENOUS | Status: AC
  Filled 2023-01-23: qty 20

## 2023-01-23 MED ORDER — BUPIVACAINE-EPINEPHRINE 0.25% -1:200000 IJ SOLN
INTRAMUSCULAR | Status: DC | PRN
  Administered 2023-01-23: 25 mL

## 2023-01-23 MED ORDER — LIDOCAINE 2% (20 MG/ML) 5 ML SYRINGE
INTRAMUSCULAR | Status: DC | PRN
  Administered 2023-01-23: 60 mg via INTRAVENOUS

## 2023-01-23 MED ORDER — FENTANYL CITRATE (PF) 250 MCG/5ML IJ SOLN
INTRAMUSCULAR | Status: DC | PRN
  Administered 2023-01-23 (×5): 50 ug via INTRAVENOUS

## 2023-01-23 MED ORDER — ACETAMINOPHEN 500 MG PO TABS: 1000.0000 mg | ORAL_TABLET | Freq: Once | ORAL | Status: AC

## 2023-01-23 MED ORDER — FENTANYL CITRATE (PF) 250 MCG/5ML IJ SOLN
INTRAMUSCULAR | Status: AC
  Filled 2023-01-23: qty 5

## 2023-01-23 MED ORDER — CHLORHEXIDINE GLUCONATE 0.12 % MT SOLN
OROMUCOSAL | Status: AC
  Administered 2023-01-23: 15 mL via OROMUCOSAL
  Filled 2023-01-23: qty 15

## 2023-01-23 MED ORDER — OXYCODONE HCL 5 MG PO TABS
ORAL_TABLET | ORAL | Status: AC
  Filled 2023-01-23: qty 1

## 2023-01-23 MED ORDER — FENTANYL CITRATE (PF) 100 MCG/2ML IJ SOLN
INTRAMUSCULAR | Status: AC
  Filled 2023-01-23: qty 2

## 2023-01-23 MED ORDER — ORAL CARE MOUTH RINSE: 15.0000 mL | Freq: Once | OROMUCOSAL | Status: AC

## 2023-01-23 MED ORDER — DOCUSATE SODIUM 100 MG PO CAPS: 100.0000 mg | ORAL_CAPSULE | Freq: Two times a day (BID) | ORAL | Status: DC

## 2023-01-23 MED ORDER — ACETAMINOPHEN 500 MG PO TABS: 1000.0000 mg | ORAL_TABLET | Freq: Four times a day (QID) | ORAL | Status: AC | PRN

## 2023-01-23 MED ORDER — MIDAZOLAM HCL 2 MG/2ML IJ SOLN
INTRAMUSCULAR | Status: AC
  Filled 2023-01-23: qty 2

## 2023-01-23 MED ORDER — SUGAMMADEX SODIUM 200 MG/2ML IV SOLN
INTRAVENOUS | Status: DC | PRN
  Administered 2023-01-23: 150 mg via INTRAVENOUS
  Administered 2023-01-23: 50 mg via INTRAVENOUS

## 2023-01-23 MED ORDER — SODIUM CHLORIDE 0.9 % IV SOLN: INTRAVENOUS | Status: DC

## 2023-01-23 MED ORDER — FENTANYL CITRATE (PF) 100 MCG/2ML IJ SOLN
25.0000 ug | INTRAMUSCULAR | Status: DC | PRN
  Administered 2023-01-23 (×3): 25 ug via INTRAVENOUS

## 2023-01-23 SURGICAL SUPPLY — 46 items
ADH SKN CLS APL DERMABOND .7 (GAUZE/BANDAGES/DRESSINGS) ×1
APL PRP STRL LF DISP 70% ISPRP (MISCELLANEOUS) ×1
APPLIER CLIP 5 13 M/L LIGAMAX5 (MISCELLANEOUS) ×1
APR CLP MED LRG 5 ANG JAW (MISCELLANEOUS) ×1
BAG COUNTER SPONGE SURGICOUNT (BAG) ×1 IMPLANT
BAG SPEC RTRVL 10 TROC 200 (ENDOMECHANICALS) ×1
BAG SPNG CNTER NS LX DISP (BAG)
BLADE CLIPPER SURG (BLADE) IMPLANT
CANISTER SUCT 3000ML PPV (MISCELLANEOUS) ×1 IMPLANT
CHLORAPREP W/TINT 26 (MISCELLANEOUS) ×1 IMPLANT
CLIP APPLIE 5 13 M/L LIGAMAX5 (MISCELLANEOUS) ×1 IMPLANT
COVER SURGICAL LIGHT HANDLE (MISCELLANEOUS) ×1 IMPLANT
DERMABOND ADVANCED .7 DNX12 (GAUZE/BANDAGES/DRESSINGS) ×1 IMPLANT
ELECT REM PT RETURN 9FT ADLT (ELECTROSURGICAL) ×1
ELECTRODE REM PT RTRN 9FT ADLT (ELECTROSURGICAL) ×1 IMPLANT
GLOVE BIO SURGEON STRL SZ8 (GLOVE) ×1 IMPLANT
GLOVE BIOGEL PI IND STRL 8 (GLOVE) ×1 IMPLANT
GOWN STRL REUS W/ TWL LRG LVL3 (GOWN DISPOSABLE) ×2 IMPLANT
GOWN STRL REUS W/ TWL XL LVL3 (GOWN DISPOSABLE) ×1 IMPLANT
GOWN STRL REUS W/TWL LRG LVL3 (GOWN DISPOSABLE) ×3
GOWN STRL REUS W/TWL XL LVL3 (GOWN DISPOSABLE) ×1
IRRIG SUCT STRYKERFLOW 2 WTIP (MISCELLANEOUS) ×1
IRRIGATION SUCT STRKRFLW 2 WTP (MISCELLANEOUS) ×1 IMPLANT
KIT BASIN OR (CUSTOM PROCEDURE TRAY) ×1 IMPLANT
KIT TURNOVER KIT B (KITS) ×1 IMPLANT
L-HOOK LAP DISP 36CM (ELECTROSURGICAL) ×1
LHOOK LAP DISP 36CM (ELECTROSURGICAL) ×1 IMPLANT
NDL 22X1.5 STRL (OR ONLY) (MISCELLANEOUS) ×1 IMPLANT
NEEDLE 22X1.5 STRL (OR ONLY) (MISCELLANEOUS) ×1 IMPLANT
NS IRRIG 1000ML POUR BTL (IV SOLUTION) ×1 IMPLANT
PAD ARMBOARD 7.5X6 YLW CONV (MISCELLANEOUS) ×1 IMPLANT
PENCIL BUTTON HOLSTER BLD 10FT (ELECTRODE) ×1 IMPLANT
POUCH RETRIEVAL ECOSAC 10 (ENDOMECHANICALS) ×1 IMPLANT
SCISSORS LAP 5X35 DISP (ENDOMECHANICALS) ×1 IMPLANT
SET TUBE SMOKE EVAC HIGH FLOW (TUBING) ×1 IMPLANT
SLEEVE Z-THREAD 5X100MM (TROCAR) ×2 IMPLANT
SPECIMEN JAR SMALL (MISCELLANEOUS) ×1 IMPLANT
SUT MNCRL AB 4-0 PS2 18 (SUTURE) IMPLANT
SUT VIC AB 4-0 PS2 27 (SUTURE) ×1 IMPLANT
TOWEL GREEN STERILE (TOWEL DISPOSABLE) ×1 IMPLANT
TOWEL GREEN STERILE FF (TOWEL DISPOSABLE) ×1 IMPLANT
TRAY LAPAROSCOPIC MC (CUSTOM PROCEDURE TRAY) ×1 IMPLANT
TROCAR BALLN 12MMX100 BLUNT (TROCAR) ×1 IMPLANT
TROCAR Z-THREAD OPTICAL 5X100M (TROCAR) ×1 IMPLANT
WARMER LAPAROSCOPE (MISCELLANEOUS) ×1 IMPLANT
WATER STERILE IRR 1000ML POUR (IV SOLUTION) ×1 IMPLANT

## 2023-01-23 NOTE — Anesthesia Preprocedure Evaluation (Addendum)
Anesthesia Evaluation  Patient identified by MRN, date of birth, ID band Patient awake    Reviewed: Allergy & Precautions, NPO status , Patient's Chart, lab work & pertinent test results  Airway Mallampati: II  TM Distance: >3 FB Neck ROM: Full    Dental  (+) Chipped, Dental Advisory Given,    Pulmonary asthma , former smoker   Pulmonary exam normal breath sounds clear to auscultation       Cardiovascular hypertension, Normal cardiovascular exam Rhythm:Regular Rate:Normal     Neuro/Psych  Headaches  negative psych ROS   GI/Hepatic negative GI ROS, Neg liver ROS,,,  Endo/Other  negative endocrine ROS    Renal/GU negative Renal ROS  negative genitourinary   Musculoskeletal negative musculoskeletal ROS (+)    Abdominal   Peds  Hematology negative hematology ROS (+)   Anesthesia Other Findings   Reproductive/Obstetrics (+) Breast feeding                              Anesthesia Physical Anesthesia Plan  ASA: 2  Anesthesia Plan: General   Post-op Pain Management: Tylenol PO (pre-op)*   Induction: Intravenous  PONV Risk Score and Plan: 3 and Midazolam, Dexamethasone and Ondansetron  Airway Management Planned: Oral ETT  Additional Equipment:   Intra-op Plan:   Post-operative Plan: Extubation in OR  Informed Consent: I have reviewed the patients History and Physical, chart, labs and discussed the procedure including the risks, benefits and alternatives for the proposed anesthesia with the patient or authorized representative who has indicated his/her understanding and acceptance.     Dental advisory given  Plan Discussed with: CRNA  Anesthesia Plan Comments:        Anesthesia Quick Evaluation

## 2023-01-23 NOTE — Anesthesia Postprocedure Evaluation (Signed)
Anesthesia Post Note  Patient: Pamela Brown  Procedure(s) Performed: LAPAROSCOPIC CHOLECYSTECTOMY (Abdomen)     Patient location during evaluation: PACU Anesthesia Type: General Level of consciousness: awake and alert Pain management: pain level controlled Vital Signs Assessment: post-procedure vital signs reviewed and stable Respiratory status: spontaneous breathing, nonlabored ventilation, respiratory function stable and patient connected to nasal cannula oxygen Cardiovascular status: blood pressure returned to baseline and stable Postop Assessment: no apparent nausea or vomiting Anesthetic complications: no  No notable events documented.  Last Vitals:  Vitals:   01/23/23 1045 01/23/23 1100  BP: 125/84 (!) 132/97  Pulse: 76 80  Resp: 15 20  Temp:  37.1 C  SpO2: 97% 95%    Last Pain:  Vitals:   01/23/23 1030  TempSrc:   PainSc: 5                  Rielynn Trulson L Jayro Mcmath

## 2023-01-23 NOTE — ED Notes (Signed)
Contact Lois for any updates on pt care.

## 2023-01-23 NOTE — Discharge Instructions (Signed)

## 2023-01-23 NOTE — Anesthesia Procedure Notes (Signed)
Procedure Name: Intubation Date/Time: 01/23/2023 9:00 AM  Performed by: Woodrum, Chelsey L, MDPre-anesthesia Checklist: Patient identified, Emergency Drugs available, Suction available and Patient being monitored Patient Re-evaluated:Patient Re-evaluated prior to induction Oxygen Delivery Method: Circle system utilized Preoxygenation: Pre-oxygenation with 100% oxygen Induction Type: IV induction Ventilation: Mask ventilation without difficulty Laryngoscope Size: Mac and 3 Grade View: Grade I Tube type: Oral Tube size: 6.5 mm Number of attempts: 1 Airway Equipment and Method: Stylet Placement Confirmation: ETT inserted through vocal cords under direct vision, positive ETCO2 and breath sounds checked- equal and bilateral Secured at: 22 cm Tube secured with: Tape Dental Injury: Teeth and Oropharynx as per pre-operative assessment

## 2023-01-23 NOTE — H&P (Addendum)
Surgical Evaluation  Chief Complaint: Abdominal pain  HPI: Very pleasant 22 year old woman who is 2 weeks postpartum from a SVD presents with abdominal pain.  The most severe element of this is in the subxiphoid/epigastric region and there is right upper quadrant/right subcostal pain as well which she became more aware of after the ultrasound that was done here in the emergency department.  She states that this is actually been going on for about a week but became more painful today.  No associated fever, chills or vomiting but does note associated nausea.  She denies any similar symptoms during her pregnancy.  She has not had any previous abdominal surgery and is otherwise healthy.  She is currently in the emergency department alone with her baby. She is breastfeeding.    No Known Allergies  Past Medical History:  Diagnosis Date   Anemia    Asthma    Headache    UTI (urinary tract infection)     Past Surgical History:  Procedure Laterality Date   THERAPEUTIC ABORTION     WISDOM TOOTH EXTRACTION Bilateral 2019    Family History  Problem Relation Age of Onset   Heart disease Mother    Other Father        unknown    Social History   Socioeconomic History   Marital status: Single    Spouse name: Not on file   Number of children: Not on file   Years of education: Not on file   Highest education level: Not on file  Occupational History   Not on file  Tobacco Use   Smoking status: Former    Types: Cigars   Smokeless tobacco: Never   Tobacco comments:    Quit with +pareg  Vaping Use   Vaping status: Former   Substances: Nicotine, THC  Substance and Sexual Activity   Alcohol use: Not Currently   Drug use: Not Currently    Types: Marijuana    Comment: last 3rd wk inNov   Sexual activity: Yes    Birth control/protection: None  Other Topics Concern   Not on file  Social History Narrative   Not on file   Social Determinants of Health   Financial Resource Strain:  Not on file  Food Insecurity: No Food Insecurity (01/06/2023)   Hunger Vital Sign    Worried About Running Out of Food in the Last Year: Never true    Ran Out of Food in the Last Year: Never true  Transportation Needs: No Transportation Needs (01/06/2023)   PRAPARE - Administrator, Civil Service (Medical): No    Lack of Transportation (Non-Medical): No  Physical Activity: Not on file  Stress: Not on file  Social Connections: Unknown (09/04/2022)   Received from [facility]   Social Network    Social Network: Not on file    No current facility-administered medications on file prior to encounter.   Current Outpatient Medications on File Prior to Encounter  Medication Sig Dispense Refill   albuterol (PROVENTIL) (5 MG/ML) 0.5% nebulizer solution Take 2.5 mg by nebulization every 6 (six) hours as needed for wheezing or shortness of breath. (Patient not taking: Reported on 01/05/2023)     Blood Pressure Monitoring DEVI 1 each by Does not apply route once a week. (Patient not taking: Reported on 01/05/2023) 1 each 0   furosemide (LASIX) 20 MG tablet Take 1 tablet (20 mg total) by mouth daily. 3 tablet 0   ibuprofen (ADVIL) 600 MG  tablet Take 1 tablet (600 mg total) by mouth every 6 (six) hours. 30 tablet 0   NIFEdipine (ADALAT CC) 30 MG 24 hr tablet Take 1 tablet (30 mg total) by mouth daily. 30 tablet 1   ondansetron (ZOFRAN-ODT) 8 MG disintegrating tablet Take 1 tablet (8 mg total) by mouth every 8 (eight) hours as needed for nausea or vomiting. (Patient not taking: Reported on 01/05/2023) 30 tablet 0   Prenatal MV & Min w/FA-DHA (PRENATAL GUMMIES PO) Take by mouth.      Review of Systems: a complete, 10pt review of systems was completed with pertinent positives and negatives as documented in the HPI  Physical Exam: Vitals:   01/23/23 0145 01/23/23 0156  BP: 123/72   Pulse: 81   Resp: (!) 21   Temp:  98.8 F (37.1 C)  SpO2: 98%    Gen: A&Ox3, no distress   Eyes: lids and conjunctivae normal, no icterus. Pupils equally round and reactive to light.  Neck: supple without mass or thyromegaly Chest: respiratory effort is normal. No crepitus or tenderness on palpation of the chest. Breath sounds equal.  Cardiovascular: RRR with palpable distal pulses, no pedal edema Gastrointestinal: soft, nondistended, moderately tender in the subxiphoid and right subcostal regions Muscoloskeletal: no clubbing or cyanosis of the fingers.  Strength is symmetrical throughout.  Neuro: No gross deficit Psych: appropriate mood and affect, normal insight/judgment intact  Skin: warm and dry      Latest Ref Rng & Units 01/22/2023    9:24 PM 01/05/2023   11:59 PM 01/03/2023    2:10 PM  CBC  WBC 4.0 - 10.5 K/uL 6.9  8.6  8.2   Hemoglobin 12.0 - 15.0 g/dL 16.1  09.6  04.5   Hematocrit 36.0 - 46.0 % 32.9  31.4  30.5   Platelets 150 - 400 K/uL 402  279  276        Latest Ref Rng & Units 01/22/2023    9:24 PM 01/03/2023    2:10 PM 10/02/2022    8:46 PM  CMP  Glucose 70 - 99 mg/dL 409  93  93   BUN 6 - 20 mg/dL 14  6  <5   Creatinine 0.44 - 1.00 mg/dL 8.11  9.14  7.82   Sodium 135 - 145 mmol/L 136  137  133   Potassium 3.5 - 5.1 mmol/L 3.7  3.7  3.7   Chloride 98 - 111 mmol/L 104  107  105   CO2 22 - 32 mmol/L 23  20  20    Calcium 8.9 - 10.3 mg/dL 9.0  8.9  8.9   Total Protein 6.5 - 8.1 g/dL 7.2  6.8  6.8   Total Bilirubin 0.3 - 1.2 mg/dL 0.3  0.4  0.4   Alkaline Phos 38 - 126 U/L 92  112  59   AST 15 - 41 U/L 20  19  18    ALT 0 - 44 U/L 19  13  12      No results found for: "INR", "PROTIME"  Imaging: US Abdomen Limited RUQ (LIVER/GB)  Result Date: 01/23/2023 CLINICAL DATA:  Right upper quadrant pain. EXAM: ULTRASOUND ABDOMEN LIMITED RIGHT UPPER QUADRANT COMPARISON:  None Available. FINDINGS: Gallbladder: Tiny layering stones are present within the gallbladder. The gallbladder is contracted and no gallbladder wall thickening is seen. A positive sonographic Murphy  sign noted by sonographer. Common bile duct: Diameter: 1.9 mm Liver: No focal lesion identified. Increased parenchymal echogenicity. Portal vein is patent on color  Doppler imaging with normal direction of blood flow towards the liver. Other: No free fluid. IMPRESSION: 1. Tiny layering stones in the gallbladder with no gallbladder wall thickening. The sonographer reports a positive sonographic Murphy sign. Correlate clinically to exclude acute cholecystitis. 2. Hepatic steatosis. Electronically Signed   By: Thornell Sartorius M.D.   On: 01/23/2023 02:05   DG Chest 2 View  Result Date: 01/22/2023 CLINICAL DATA:  Chest pain EXAM: CHEST - 2 VIEW COMPARISON:  10/02/2022 FINDINGS: The heart size and mediastinal contours are within normal limits. Both lungs are clear. The visualized skeletal structures are unremarkable. IMPRESSION: No active cardiopulmonary disease. Electronically Signed   By: Jasmine Pang M.D.   On: 01/22/2023 22:04     A/P: Cholecystitis. I recommend proceeding with laparoscopic cholecystectomy with possible cholangiogram. Discussed relevant anatomy, surgical technique, and risks of surgery including bleeding, infection, pain, scarring, intraabdominal injury specifically to the common bile duct and sequelae, bile leak, conversion to open surgery, subtotal cholecystectomy, blood clot, pneumonia, heart attack, stroke, failure to resolve symptoms, etc. Questions welcomed and answered.  Patient wishes to proceed.  Will plan to proceed later this morning with Dr. Janee Morn, anticipate discharge home from the PACU.    Patient Active Problem List   Diagnosis Date Noted   Vaginal delivery 01/06/2023   Gestational hypertension 01/05/2023   PROM (premature rupture of membranes) 01/05/2023   Limited prenatal care 11/29/2022   Pregnancy complicated by obesity 11/29/2022   Supervision of other normal pregnancy, antepartum 09/06/2022   Asthma 07/23/2022       Phylliss Blakes, MD [facility]  See AMION to contact appropriate on-call provider   Mdm-high

## 2023-01-23 NOTE — Progress Notes (Signed)
Patient ID: Pamela Brown, female   DOB: Jun 02, 2001, 22 y.o.   MRN: [id number] Day of Surgery    Subjective: In pre-op. C/O epigastric and RUQ pain. ROS negative except as listed above. Objective: Vital signs in last 24 hours: Temp:  [97.7 F (36.5 C)-98.8 F (37.1 C)] 97.9 F (36.6 C) (07/29 0801) Pulse Rate:  [61-95] 73 (07/29 0801) Resp:  [15-25] 17 (07/29 0801) BP: (120-134)/(72-90) 134/90 (07/29 0801) SpO2:  [95 %-100 %] 95 % (07/29 0801) Weight:  [73 kg] 73 kg (07/29 0803)    Intake/Output from previous day: 07/28 0701 - 07/29 0700 In: 195.1 [IV Piggyback:195.1] Out: -  Intake/Output this shift: No intake/output data recorded.  General appearance: alert and cooperative Resp: clear to auscultation bilaterally GI: tender epig and RUQ  Lab Results: CBC  Recent Labs    01/22/23 2124  WBC 6.9  HGB 10.0*  HCT 32.9*  PLT 402*   BMET Recent Labs    01/22/23 2124  NA 136  K 3.7  CL 104  CO2 23  GLUCOSE 104*  BUN 14  CREATININE 0.88  CALCIUM 9.0   PT/INR No results for input(s): "LABPROT", "INR" in the last 72 hours. ABG No results for input(s): "PHART", "HCO3" in the last 72 hours.  Invalid input(s): "PCO2", "PO2"  Studies/Results: US Abdomen Limited RUQ (LIVER/GB)  Result Date: 01/23/2023 CLINICAL DATA:  Right upper quadrant pain. EXAM: ULTRASOUND ABDOMEN LIMITED RIGHT UPPER QUADRANT COMPARISON:  None Available. FINDINGS: Gallbladder: Tiny layering stones are present within the gallbladder. The gallbladder is contracted and no gallbladder wall thickening is seen. A positive sonographic Murphy sign noted by sonographer. Common bile duct: Diameter: 1.9 mm Liver: No focal lesion identified. Increased parenchymal echogenicity. Portal vein is patent on color Doppler imaging with normal direction of blood flow towards the liver. Other: No free fluid. IMPRESSION: 1. Tiny layering stones in the gallbladder with no gallbladder wall thickening. The sonographer  reports a positive sonographic Murphy sign. Correlate clinically to exclude acute cholecystitis. 2. Hepatic steatosis. Electronically Signed   By: Thornell Sartorius M.D.   On: 01/23/2023 02:05   DG Chest 2 View  Result Date: 01/22/2023 CLINICAL DATA:  Chest pain EXAM: CHEST - 2 VIEW COMPARISON:  10/02/2022 FINDINGS: The heart size and mediastinal contours are within normal limits. Both lungs are clear. The visualized skeletal structures are unremarkable. IMPRESSION: No active cardiopulmonary disease. Electronically Signed   By: Jasmine Pang M.D.   On: 01/22/2023 22:04    Anti-infectives: Anti-infectives (From admission, onward)    Start     Dose/Rate Route Frequency Ordered Stop   01/23/23 0815  ceFAZolin (ANCEF) IVPB 2g/100 mL premix        2 g 200 mL/hr over 30 Minutes Intravenous On call to O.R. 01/23/23 0809 01/24/23 0559   01/23/23 0300  [MAR Hold]  cefTRIAXone (ROCEPHIN) 2 g in sodium chloride 0.9 % 100 mL IVPB        (MAR Hold since Mon 01/23/2023 at 0758.Hold Reason: Transfer to a Procedural area)   2 g 200 mL/hr over 30 Minutes Intravenous Daily at bedtime 01/23/23 0254     01/23/23 0300  [MAR Hold]  metroNIDAZOLE (FLAGYL) IVPB 500 mg        (MAR Hold since Mon 01/23/2023 at 0758.Hold Reason: Transfer to a Procedural area)   500 mg 100 mL/hr over 60 Minutes Intravenous 2 times daily 01/23/23 0254         Assessment/Plan: Cholecystitis - plan laparoscopic cholecystectomy, possible IOC.  Procedure, risks, and benefits discussed. She agrees. Ancef IV.  LOS: 0 days    Violeta Gelinas, MD, MPH, FACS Trauma & General Surgery Use [email or web address] to contact on call provider  01/23/2023

## 2023-01-23 NOTE — Transfer of Care (Signed)
Immediate Anesthesia Transfer of Care Note  Patient: Rashida Kirschenbaum  Procedure(s) Performed: LAPAROSCOPIC CHOLECYSTECTOMY (Abdomen)  Patient Location: PACU  Anesthesia Type:General  Level of Consciousness: awake and alert   Airway & Oxygen Therapy: Patient Spontanous Breathing and Patient connected to nasal cannula oxygen  Post-op Assessment: Report given to RN, Post -op Vital signs reviewed and stable, and Patient moving all extremities X 4  Post vital signs: Reviewed and stable  Last Vitals:  Vitals Value Taken Time  BP 145/93   Temp    Pulse 96 01/23/23 1004  Resp    SpO2 96 % 01/23/23 1004  Vitals shown include unfiled device data.  Last Pain:  Vitals:   01/23/23 0823  TempSrc:   PainSc: 4       Patients Stated Pain Goal: 2 (01/23/23 5956)  Complications: No notable events documented.

## 2023-01-23 NOTE — ED Notes (Signed)
MAU RN at bedside assisting with breast pump

## 2023-01-23 NOTE — ED Provider Notes (Signed)
[facility] Emergency Department Provider Note MRN:  [id number]  Arrival date & time: 01/23/23     Chief Complaint   Abdominal Pain   History of Present Illness   Pamela Brown is a 22 y.o. year-old female presents to the ED with chief complaint of RUQ pain.  Onset today.  Reports sharp and severe pain.  Denies fever, chills, or vomiting.  She is 2 weeks postpartum.  She states that she has also had some burning with urination.  She is breastfeeding.  History provided by patient.   Review of Systems  Pertinent positive and negative review of systems noted in HPI.    Physical Exam   Vitals:   01/23/23 0145 01/23/23 0156  BP: 123/72   Pulse: 81   Resp: (!) 21   Temp:  98.8 F (37.1 C)  SpO2: 98%     CONSTITUTIONAL:  well-appearing, NAD NEURO:  Alert and oriented x 3, CN 3-12 grossly intact EYES:  eyes equal and reactive ENT/NECK:  Supple, no stridor  CARDIO:  normal rate, regular rhythm, appears well-perfused  PULM:  No respiratory distress, CTAB GI/GU:  non-distended, RUQ TTP MSK/SPINE:  No gross deformities, no edema, moves all extremities  SKIN:  no rash, atraumatic   *Additional and/or pertinent findings included in MDM below  Diagnostic and Interventional Summary    EKG Interpretation Date/Time:  Sunday January 22 2023 21:16:37 EDT Ventricular Rate:  89 PR Interval:  170 QRS Duration:  66 QT Interval:  334 QTC Calculation: 406 R Axis:   65  Text Interpretation: Normal sinus rhythm Normal ECG When compared with ECG of 02-Oct-2022 21:04, No significant change was found Confirmed by Dione Booze ([id number]) on 01/22/2023 11:13:35 PM       Labs Reviewed  CBC WITH DIFFERENTIAL/PLATELET - Abnormal; Notable for the following components:      Result Value   Hemoglobin 10.0 (*)    HCT 32.9 (*)    MCH 25.3 (*)    Platelets 402 (*)    nRBC 0.3 (*)    All other components within normal limits  COMPREHENSIVE METABOLIC PANEL - Abnormal;  Notable for the following components:   Glucose, Bld 104 (*)    All other components within normal limits  D-DIMER, QUANTITATIVE  TROPONIN I (HIGH SENSITIVITY)  TROPONIN I (HIGH SENSITIVITY)    US Abdomen Limited RUQ (LIVER/GB)  Final Result    DG Chest 2 View  Final Result      Medications - No data to display   Procedures  /  Critical Care Procedures  ED Course and Medical Decision Making  I have reviewed the triage vital signs, the nursing notes, and pertinent available records from the EMR.  Social Determinants Affecting Complexity of Care: Patient has no clinically significant social determinants affecting this chief complaint..   ED Course:    Medical Decision Making Patient here with RUQ abdominal pain.  Labs ordered in triage are reassuring.  I'll add a RUQ [place] to look for gallstones.  UA pending.  Amount and/or Complexity of Data Reviewed Radiology: ordered and independent interpretation performed.    Details: Gallstones present  Risk Decision regarding hospitalization.         Consultants: I consulted with Dr. Doylene Canard, who recommends probable cholecystectomy.  Appreciate Dr. Doylene Canard for seeing the patient.   Treatment and Plan: Patient's exam and diagnostic results are concerning for symptomatic cholelithiasis.  Feel that patient will need admission to the hospital for further treatment and evaluation.  Final Clinical Impressions(s) / ED Diagnoses     ICD-10-CM   1. Symptomatic cholelithiasis  K80.20       ED Discharge Orders     None         Discharge Instructions Discussed with and Provided to Patient:   Discharge Instructions   None      Roxy Horseman, PA-C 01/23/23 0239    Dione Booze, MD 01/23/23 [phone]

## 2023-01-23 NOTE — Op Note (Signed)
  01/23/2023  9:58 AM  PATIENT:  Pamela Brown  22 y.o. female  PRE-OPERATIVE DIAGNOSIS:  CHOLECYSTITIS  POST-OPERATIVE DIAGNOSIS:  CHOLECYSTITIS  PROCEDURE:  Procedure(s): LAPAROSCOPIC CHOLECYSTECTOMY  SURGEON:  Surgeon(s): Violeta Gelinas, MD  ASSISTANTS: Barnetta Chapel, PA-C   ANESTHESIA:   local and general  EBL:  Total I/O In: 800 [I.V.:800] Out: 10 [Blood:10]  BLOOD ADMINISTERED:none  DRAINS: none   SPECIMEN:  Excision  DISPOSITION OF SPECIMEN:  PATHOLOGY  COUNTS:  YES  DICTATION: .Dragon Dictation Procedure detail: Informed sent was obtained.  She received intravenous antibiotics.  Her abdomen was prepped and draped in a sterile fashion.  A timeout procedure was performed.The infraumbilical region was infiltrated with local. Infraumbilical incision was made. Subcutaneous tissues were dissected down revealing the anterior fascia. This was divided sharply along the midline. Peritoneal cavity was entered under direct vision without complication. A 0 Vicryl pursestring was placed around the fascial opening. Hassan trocar was inserted into the abdomen. The abdomen was insufflated with carbon dioxide in standard fashion. Under direct vision a 5 mm epigastric and 5 mm right abdominal port x 2 were placed.  Local was used at each port site.  Laparoscopic exploration revealed gallbladder with evidence of acute and chronic inflammation.  There was a lot of omentum stuck to it.  The dome of the gallbladder was retracted superior and medially.  The infundibulum was covered in omentum.  This was gently and gradually swept away.  Cautery was used to get good hemostasis.  Once the infundibulum was identified it was retracted inferior and laterally.  Dissection began laterally and progressed medially identifying the cystic artery and the cystic duct.  The cystic artery was clipped twice proximally, once distally and divided.  The cystic duct was further dissected until a critical view of  safety was obtained.  3 clips were then placed proximally on it and 1 was placed distally.  It was divided.  The gallbladder was taken off the liver bed using Bovie cautery.  It was placed in a bag and removed from the abdomen.  It was sent to pathology.  The liver bed was copiously irrigated and cauterized to get good hemostasis.  All the clips remain in excellent position.  The liver bed was then dry.  Irrigation fluid was evacuated.  Ports were removed under direct vision.  Infraumbilical fascia was closed by tying the pursestring.  All wounds were irrigated and the skin of each was closed with 4-0 Monocryl followed by Dermabond.  All counts were correct.  She tolerated procedure well without apparent complication and was taken recovery in stable condition.  PATIENT DISPOSITION:  PACU - hemodynamically stable.   Delay start of Pharmacological VTE agent (>24hrs) due to surgical blood loss or risk of bleeding:  no  Violeta Gelinas, MD, MPH, FACS Pager: [phone]  7/29/20249:58 AM

## 2023-01-24 ENCOUNTER — Encounter (HOSPITAL_COMMUNITY): Payer: Self-pay | Admitting: General Surgery

## 2023-01-27 ENCOUNTER — Encounter: Payer: Self-pay | Admitting: General Practice

## 2023-02-02 ENCOUNTER — Telehealth (HOSPITAL_COMMUNITY): Payer: Self-pay | Admitting: *Deleted

## 2023-02-02 NOTE — Telephone Encounter (Signed)
02/02/2023  Name: Pamela Brown MRN: [id number] DOB: 11-02-2000  Reason for Call:  Transition of Care Hospital Discharge Call  Contact Status: Patient Contact Status: Complete  Language assistant needed: Interpreter Mode: Interpreter Not Needed        Follow-Up Questions: Do You Have Any Concerns About Your Health As You Heal From Delivery?: No Do You Have Any Concerns About Your Infants Health?: No  Edinburgh Postnatal Depression Scale:  In the Past 7 Days: I have been able to laugh and see the funny side of things.: As much as I always could I have looked forward with enjoyment to things.: As much as I ever did I have blamed myself unnecessarily when things went wrong.: Not very often I have been anxious or worried for no good reason.: No, not at all I have felt scared or panicky for no good reason.: No, not at all Things have been getting on top of me.: No, I have been coping as well as ever I have been so unhappy that I have had difficulty sleeping.: Not at all I have felt sad or miserable.: Not very often I have been so unhappy that I have been crying.: No, never The thought of harming myself has occurred to me.: Never Edinburgh Postnatal Depression Scale Total: 2  PHQ2-9 Depression Scale:     Discharge Follow-up:    Post-discharge interventions: Reviewed Newborn Safe Sleep Practices  Salena Saner, RN 02/02/2023 14:30

## 2023-05-14 ENCOUNTER — Emergency Department (HOSPITAL_COMMUNITY)
Admission: EM | Admit: 2023-05-14 | Discharge: 2023-05-15 | Discharge status: Against Medical Advice | Payer: Medicaid Other | Attending: Emergency Medicine | Admitting: Emergency Medicine

## 2023-05-14 ENCOUNTER — Other Ambulatory Visit: Payer: Self-pay

## 2023-05-14 ENCOUNTER — Emergency Department (HOSPITAL_COMMUNITY): Payer: Medicaid Other

## 2023-05-14 DIAGNOSIS — Z5321 Procedure and treatment not carried out due to patient leaving prior to being seen by health care provider: Secondary | ICD-10-CM | POA: Insufficient documentation

## 2023-05-14 DIAGNOSIS — R079 Chest pain, unspecified: Secondary | ICD-10-CM | POA: Diagnosis not present

## 2023-05-14 DIAGNOSIS — Z20822 Contact with and (suspected) exposure to covid-19: Secondary | ICD-10-CM | POA: Diagnosis not present

## 2023-05-14 DIAGNOSIS — J45909 Unspecified asthma, uncomplicated: Secondary | ICD-10-CM | POA: Insufficient documentation

## 2023-05-14 DIAGNOSIS — R0602 Shortness of breath: Secondary | ICD-10-CM | POA: Diagnosis present

## 2023-05-14 DIAGNOSIS — R509 Fever, unspecified: Secondary | ICD-10-CM | POA: Insufficient documentation

## 2023-05-14 LAB — HCG, SERUM, QUALITATIVE: Preg, Serum: NEGATIVE

## 2023-05-14 LAB — COMPREHENSIVE METABOLIC PANEL
ALT: 20 U/L (ref 0–44)
AST: 20 U/L (ref 15–41)
Albumin: 3.8 g/dL (ref 3.5–5.0)
Alkaline Phosphatase: 74 U/L (ref 38–126)
Anion gap: 11 (ref 5–15)
BUN: 8 mg/dL (ref 6–20)
CO2: 19 mmol/L — ABNORMAL LOW (ref 22–32)
Calcium: 9.4 mg/dL (ref 8.9–10.3)
Chloride: 102 mmol/L (ref 98–111)
Creatinine, Ser: 0.92 mg/dL (ref 0.44–1.00)
GFR, Estimated: >60 mL/min (ref >60)
Glucose, Bld: 109 mg/dL — ABNORMAL HIGH (ref 70–99)
Potassium: 3.5 mmol/L (ref 3.5–5.1)
Sodium: 132 mmol/L — ABNORMAL LOW (ref 135–145)
Total Bilirubin: 0.7 mg/dL (ref <1.2)
Total Protein: 7.7 g/dL (ref 6.5–8.1)

## 2023-05-14 LAB — RESP PANEL BY RT-PCR (RSV, FLU A&B, COVID)  RVPGX2
Influenza A by PCR: NEGATIVE
Influenza B by PCR: NEGATIVE
Resp Syncytial Virus by PCR: NEGATIVE
SARS Coronavirus 2 by RT PCR: NEGATIVE

## 2023-05-14 LAB — CBC
HCT: 35.6 % — ABNORMAL LOW (ref 36.0–46.0)
Hemoglobin: 11.5 g/dL — ABNORMAL LOW (ref 12.0–15.0)
MCH: 25.1 pg — ABNORMAL LOW (ref 26.0–34.0)
MCHC: 32.3 g/dL (ref 30.0–36.0)
MCV: 77.7 fL — ABNORMAL LOW (ref 80.0–100.0)
Platelets: 337 10*3/uL (ref 150–400)
RBC: 4.58 MIL/uL (ref 3.87–5.11)
RDW: 15.3 % (ref 11.5–15.5)
WBC: 13.6 10*3/uL — ABNORMAL HIGH (ref 4.0–10.5)
nRBC: 0 % (ref 0.0–0.2)

## 2023-05-14 MED ORDER — ALBUTEROL SULFATE (2.5 MG/3ML) 0.083% IN NEBU
5.0000 mg | INHALATION_SOLUTION | Freq: Once | RESPIRATORY_TRACT | Status: AC
  Administered 2023-05-14: 5 mg via RESPIRATORY_TRACT
  Filled 2023-05-14: qty 6

## 2023-05-14 NOTE — ED Triage Notes (Signed)
Pt here from home with c/o sob and some chest pain , pt has hx of asthma but has nt had problems since middle school , had fever at home

## 2023-05-14 NOTE — ED Notes (Signed)
Pt called multiple times no answer 

## 2023-09-09 NOTE — Progress Notes (Deleted)
   ANNUAL EXAM Patient name: Pamela Brown MRN [id number]  Date of birth: May 28, 2001 Chief Complaint:   No chief complaint on file.  History of Present Illness:   Pamela Brown is a 23 y.o. G45P1011 female being seen today for a routine annual exam.   Current complaints: ***  No LMP recorded.   The pregnancy intention screening data noted above was reviewed. Potential methods of contraception were discussed. The patient elected to proceed with No data recorded.   Last pap ***. Results were: {Pap findings:25134}. H/O abnormal pap: {yes/yes***/no:23866} Last mammogram: ***. Results were: {normal, abnormal, n/a:23837}. Family h/o breast cancer: {yes***/no:23838} Last colonoscopy: ***. Results were: {normal, abnormal, n/a:23837}. Family h/o colorectal cancer: {yes***/no:23838} STI screening: Contraception:  Review of Systems:   Pertinent items are noted in HPI Denies any headaches, blurred vision, fatigue, shortness of breath, chest pain, abdominal pain, abnormal vaginal discharge/itching/odor/irritation, problems with periods, bowel movements, urination, or intercourse unless otherwise stated above. Pertinent History Reviewed:  Reviewed past medical,surgical, social and family history.  Reviewed problem list, medications and allergies. Physical Assessment:  There were no vitals filed for this visit.There is no height or weight on file to calculate BMI.        Physical Examination:   General appearance - well appearing, and in no distress  Mental status - alert, oriented to person, place, and time  Psych:  She has a normal mood and affect  Skin - warm and dry, normal color, no suspicious lesions noted  Chest - effort normal, all lung fields clear to auscultation bilaterally  Heart - normal rate and regular rhythm  Neck:  midline trachea, no thyromegaly or nodules  Breasts - breasts appear normal, no suspicious masses, no skin or nipple changes or  axillary nodes  Abdomen - soft,  nontender, nondistended, no masses or organomegaly  Pelvic - VULVA: normal appearing vulva with no masses, tenderness or lesions  VAGINA: normal appearing vagina with normal color and discharge, no lesions  CERVIX: normal appearing cervix without discharge or lesions, no CMT  Thin prep pap is {Desc; done/not:10129} *** HR HPV cotesting  UTERUS: uterus is felt to be normal size, shape, consistency and nontender   ADNEXA: No adnexal masses or tenderness noted.  Extremities:  No swelling or varicosities noted  Chaperone present for exam  No results found for this or any previous visit (from the past 24 hours).  Assessment & Plan:  1. Encounter for annual routine gynecological examination (Primary) 2. Cervical cancer screening - Cervical cancer screening: Discussed screening Q3 years. Reviewed importance of annual exams and limits of pap smear. Pap with reflex HPV *** - GC/CT: Discussed and recommended. Pt  {Blank single:19197::"accepts","declines"} - Gardasil: {Blank single:19197::"***","has not yet had. Will provide information","completed","has not yet had. Counseling provided and she declines","Has not yet had. Counseling provided and pt accepts"} - Birth Control: {Birth control type:23956} - Breast Health: Encouraged self breast awareness/exams.  - Mammogram: {Mammo f/u:25212::"@ 24yo"}, or sooner if problems - Colonoscopy: {TCS f/u:25213::"@ 24yo"}, or sooner if problems - Follow-up: 12 months and prn   No orders of the defined types were placed in this encounter.   Meds: No orders of the defined types were placed in this encounter.   Follow-up: No follow-ups on file.  Ralene Muskrat, [place] 09/09/2023 5:38 PM

## 2023-09-11 ENCOUNTER — Ambulatory Visit: Payer: Medicaid Other | Admitting: Physician Assistant

## 2023-09-11 DIAGNOSIS — Z124 Encounter for screening for malignant neoplasm of cervix: Secondary | ICD-10-CM

## 2023-09-11 DIAGNOSIS — Z01419 Encounter for gynecological examination (general) (routine) without abnormal findings: Secondary | ICD-10-CM

## 2024-01-13 ENCOUNTER — Ambulatory Visit (HOSPITAL_COMMUNITY)
Admission: EM | Admit: 2024-01-13 | Discharge: 2024-01-13 | Disposition: A | Discharge status: Home / Self Care | Attending: Emergency Medicine | Admitting: Emergency Medicine

## 2024-01-13 ENCOUNTER — Encounter (HOSPITAL_COMMUNITY): Payer: Self-pay | Admitting: *Deleted

## 2024-01-13 DIAGNOSIS — H7292 Unspecified perforation of tympanic membrane, left ear: Secondary | ICD-10-CM

## 2024-01-13 DIAGNOSIS — S0991XA Unspecified injury of ear, initial encounter: Secondary | ICD-10-CM | POA: Diagnosis not present

## 2024-01-13 MED ORDER — AMOXICILLIN-POT CLAVULANATE 875-125 MG PO TABS
1.0000 | ORAL_TABLET | Freq: Two times a day (BID) | ORAL | 0 refills | Status: AC
Start: 2024-01-13 — End: ?

## 2024-01-13 MED ORDER — OFLOXACIN 0.3 % OT SOLN
5.0000 [drp] | Freq: Two times a day (BID) | OTIC | 0 refills | Status: AC
Start: 2024-01-13 — End: ?
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
## 2024-01-13 NOTE — Discharge Instructions (Signed)
 Start taking Augmentin  twice daily for 7 days. Use 5 drops of ofloxacin  twice daily for additional infection coverage. Follow-up with Dr. Karis who is an ear nose and throat doctor for further evaluation and management. Otherwise follow-up with primary care provider or return here as needed.

## 2024-02-19 ENCOUNTER — Encounter (HOSPITAL_COMMUNITY): Payer: Self-pay | Admitting: Emergency Medicine

## 2024-02-19 ENCOUNTER — Ambulatory Visit (HOSPITAL_COMMUNITY)
Admission: EM | Admit: 2024-02-19 | Discharge: 2024-02-19 | Disposition: A | Discharge status: Home / Self Care | Attending: Emergency Medicine | Admitting: Emergency Medicine

## 2024-02-19 DIAGNOSIS — N611 Abscess of the breast and nipple: Secondary | ICD-10-CM | POA: Diagnosis not present

## 2024-02-19 MED ORDER — DOXYCYCLINE HYCLATE 100 MG PO CAPS: 100.0000 mg | ORAL_CAPSULE | Freq: Two times a day (BID) | ORAL | 0 refills | Status: AC

## 2024-02-19 NOTE — Discharge Instructions (Signed)
 Start taking doxycycline  twice daily for 10 days for infection coverage. Apply warm compress to the area to help decrease swelling and promote drainage. Return here if you notice increased swelling, spreading of redness, increased pain, or lots of drainage from the area. Otherwise follow-up with your primary care provider for further evaluation and management.

## 2024-02-19 NOTE — ED Triage Notes (Signed)
 Pt reports has a knot on left nipple area for 2 weeks reports getting larger and more painful. Denies drainage but reports redness for 3 days. Mother has breast cancer

## 2024-02-19 NOTE — ED Provider Notes (Signed)
 [facility]    CSN: [id number] Arrival date & time: 02/19/24  9048      History   Chief Complaint Chief Complaint  Patient presents with   Breast Mass    HPI Pamela Brown is a 23 y.o. female.   Patient presents with lump to her left nipple for about 2 weeks that is progressively become larger and more painful.  Patient states that the area became red and more swollen over the last 3 days.   Denies any drainage from the area.  Denies fever, body aches, chills, and weakness. Patient states that she is no longer breast-feeding and has not breast-fed since last year.  Patient does report family history of breast cancer.  The history is provided by the patient and medical records.    Past Medical History:  Diagnosis Date   Anemia    Asthma    Headache    UTI (urinary tract infection)     Patient Active Problem List   Diagnosis Date Noted   Vaginal delivery 01/06/2023   Gestational hypertension 01/05/2023   PROM (premature rupture of membranes) 01/05/2023   Limited prenatal care 11/29/2022   Pregnancy complicated by obesity 11/29/2022   Supervision of other normal pregnancy, antepartum 09/06/2022   Asthma 07/23/2022    Past Surgical History:  Procedure Laterality Date   CHOLECYSTECTOMY N/A 01/23/2023   Procedure: LAPAROSCOPIC CHOLECYSTECTOMY;  Surgeon: Sebastian Moles, MD;  Location: [facility] OR;  Service: General;  Laterality: N/A;   THERAPEUTIC ABORTION     WISDOM TOOTH EXTRACTION Bilateral 2019    OB History     Gravida  2   Para  1   Term  1   Preterm  0   AB  1   Living  1      SAB  0   IAB  1   Ectopic  0   Multiple  0   Live Births  1            Home Medications    Prior to Admission medications   Medication Sig Start Date End Date Taking? Authorizing Provider  doxycycline  (VIBRAMYCIN ) 100 MG capsule Take 1 capsule (100 mg total) by mouth 2 (two) times daily. 02/19/24  Yes Johnie Flaming A, NP  acetaminophen  (TYLENOL )  500 MG tablet Take 2 tablets (1,000 mg total) by mouth every 6 (six) hours as needed for mild pain, moderate pain, fever or headache. 01/23/23   Tammy Sor, PA-C  albuterol  (PROVENTIL ) (5 MG/ML) 0.5% nebulizer solution Take 2.5 mg by nebulization every 6 (six) hours as needed for wheezing or shortness of breath. Patient not taking: Reported on 01/05/2023    [provider]  amoxicillin -clavulanate (AUGMENTIN ) 875-125 MG tablet Take 1 tablet by mouth every 12 (twelve) hours. 01/13/24   Johnie Flaming A, NP  Blood Pressure Monitoring DEVI 1 each by Does not apply route once a week. Patient not taking: Reported on 01/05/2023 09/06/22   Cresenzo, John V, MD  furosemide  (LASIX ) 20 MG tablet Take 1 tablet (20 mg total) by mouth daily. 01/09/23   Marylen Aleck HERO, CNM  ibuprofen  (ADVIL ) 600 MG tablet Take 1 tablet (600 mg total) by mouth every 6 (six) hours. 01/08/23   Marylen Aleck HERO, CNM  NIFEdipine  (ADALAT  CC) 30 MG 24 hr tablet Take 1 tablet (30 mg total) by mouth daily. 01/09/23   Marylen Aleck HERO, CNM  ofloxacin  (FLOXIN ) 0.3 % OTIC solution Place 5 drops into the left ear 2 (two) times  daily. 01/13/24   Johnie Flaming A, NP  ondansetron  (ZOFRAN -ODT) 8 MG disintegrating tablet Take 1 tablet (8 mg total) by mouth every 8 (eight) hours as needed for nausea or vomiting. Patient not taking: Reported on 01/05/2023 01/03/23   Claudene, [place] , CNM  oxyCODONE  (OXY IR/ROXICODONE ) 5 MG immediate release tablet Take 1 tablet (5 mg total) by mouth every 6 (six) hours as needed for severe pain. 01/23/23   Tammy Sor, PA-C  Prenatal MV & Min w/FA-DHA (PRENATAL GUMMIES PO) Take by mouth.    [provider]    Family History Family History  Problem Relation Age of Onset   Heart disease Mother    Other Father        unknown    Social History Social History   Tobacco Use   Smoking status: Former    Types: Cigars   Smokeless tobacco: Never   Tobacco comments:    Quit with +pareg   Vaping Use   Vaping status: Former   Substances: Nicotine, THC  Substance Use Topics   Alcohol use: Not Currently   Drug use: Not Currently    Types: Marijuana    Comment: last 3rd wk inNov     Allergies   Patient has no known allergies.   Review of Systems Review of Systems  Per HPI  Physical Exam Triage Vital Signs ED Triage Vitals  Encounter Vitals Group     BP 02/19/24 1114 111/74     Girls Systolic BP Percentile --      Girls Diastolic BP Percentile --      Boys Systolic BP Percentile --      Boys Diastolic BP Percentile --      Pulse Rate 02/19/24 1114 73     Resp 02/19/24 1114 16     Temp 02/19/24 1114 98.1 F (36.7 C)     Temp Source 02/19/24 1114 Oral     SpO2 02/19/24 1114 97 %     Weight --      Height --      Head Circumference --      Peak Flow --      Pain Score 02/19/24 1113 7     Pain Loc --      Pain Education --      Exclude from Growth Chart --    No data found.  Updated Vital Signs BP 111/74 (BP Location: Left Arm)   Pulse 73   Temp 98.1 F (36.7 C) (Oral)   Resp 16   LMP 01/31/2024 (Exact Date)   SpO2 97%   Visual Acuity Right Eye Distance:   Left Eye Distance:   Bilateral Distance:    Right Eye Near:   Left Eye Near:    Bilateral Near:     Physical Exam Vitals and nursing note reviewed. Chaperone present: Declined chaperone.  Constitutional:      General: She is awake. She is not in acute distress.    Appearance: Normal appearance. She is well-developed and well-groomed. She is not ill-appearing.  Chest:  Breasts:    Left: Swelling and tenderness present. No bleeding, mass or nipple discharge.     Comments: Swelling, tenderness, and erythema noted to left nipple.  Approximately centimeter in diameter area of induration noted to the upper left nipple. Neurological:     Mental Status: She is alert.  Psychiatric:        Behavior: Behavior is cooperative.      UC Treatments / Results  Labs (all  labs ordered are  listed, but only abnormal results are displayed) Labs Reviewed - No data to display  EKG   Radiology No results found.  Procedures Procedures (including critical care time)  Medications Ordered in UC Medications - No data to display  Initial Impression / Assessment and Plan / UC Course  I have reviewed the triage vital signs and the nursing notes.  Pertinent labs & imaging results that were available during my care of the patient were reviewed by me and considered in my medical decision making (see chart for details).     Patient is overall well-appearing.  Vitals are stable.  Exam findings consistent with abscess of the left nipple.  Deferred I&D due to lack of fluctuance.  Prescribed doxycycline .  Discussed follow-up and return precautions. Final Clinical Impressions(s) / UC Diagnoses   Final diagnoses:  Abscess of left nipple     Discharge Instructions      Start taking doxycycline  twice daily for 10 days for infection coverage. Apply warm compress to the area to help decrease swelling and promote drainage. Return here if you notice increased swelling, spreading of redness, increased pain, or lots of drainage from the area. Otherwise follow-up with your primary care provider for further evaluation and management.   ED Prescriptions     Medication Sig Dispense Auth. Provider   doxycycline  (VIBRAMYCIN ) 100 MG capsule Take 1 capsule (100 mg total) by mouth 2 (two) times daily. 20 capsule Johnie Flaming A, NP      PDMP not reviewed this encounter.   Johnie Flaming A, NP 02/19/24 1155
